# Patient Record
Sex: Female | Born: 1980 | Race: White | Hispanic: No | Smoking: Never smoker
Health system: Southern US, Community
[De-identification: ages and names within clinical notes are randomized; demographics above are authoritative.]

## PROBLEM LIST (undated history)

## (undated) DIAGNOSIS — R519 Headache, unspecified: Secondary | ICD-10-CM

## (undated) DIAGNOSIS — N1370 Vesicoureteral-reflux, unspecified: Secondary | ICD-10-CM

## (undated) DIAGNOSIS — K602 Anal fissure, unspecified: Secondary | ICD-10-CM

## (undated) HISTORY — DX: Headache, unspecified: R51.9

## (undated) HISTORY — DX: Vesicoureteral-reflux, unspecified: N13.70

## (undated) HISTORY — DX: Anal fissure, unspecified: K60.2

---
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
## 2005-02-13 ENCOUNTER — Ambulatory Visit: Payer: Self-pay | Admitting: Family Medicine

## 2005-12-09 ENCOUNTER — Ambulatory Visit: Payer: Self-pay | Admitting: Family Medicine

## 2005-12-09 ENCOUNTER — Other Ambulatory Visit: Admission: RE | Admit: 2005-12-09 | Discharge: 2005-12-09 | Payer: Self-pay | Admitting: Family Medicine

## 2005-12-09 ENCOUNTER — Encounter: Payer: Self-pay | Admitting: Family Medicine

## 2005-12-09 LAB — CONVERTED CEMR LAB: Pap Smear: NORMAL

## 2006-12-11 ENCOUNTER — Telehealth (INDEPENDENT_AMBULATORY_CARE_PROVIDER_SITE_OTHER): Payer: Self-pay | Admitting: *Deleted

## 2006-12-14 ENCOUNTER — Encounter: Payer: Self-pay | Admitting: Family Medicine

## 2006-12-14 DIAGNOSIS — E785 Hyperlipidemia, unspecified: Secondary | ICD-10-CM | POA: Insufficient documentation

## 2006-12-14 DIAGNOSIS — Z87898 Personal history of other specified conditions: Secondary | ICD-10-CM | POA: Insufficient documentation

## 2006-12-14 DIAGNOSIS — D649 Anemia, unspecified: Secondary | ICD-10-CM | POA: Insufficient documentation

## 2006-12-15 ENCOUNTER — Ambulatory Visit: Payer: Self-pay | Admitting: Family Medicine

## 2006-12-15 ENCOUNTER — Other Ambulatory Visit: Admission: RE | Admit: 2006-12-15 | Discharge: 2006-12-15 | Payer: Self-pay | Admitting: Family Medicine

## 2006-12-15 ENCOUNTER — Encounter: Payer: Self-pay | Admitting: Family Medicine

## 2006-12-15 DIAGNOSIS — R61 Generalized hyperhidrosis: Secondary | ICD-10-CM | POA: Insufficient documentation

## 2006-12-23 ENCOUNTER — Encounter (INDEPENDENT_AMBULATORY_CARE_PROVIDER_SITE_OTHER): Payer: Self-pay | Admitting: *Deleted

## 2006-12-23 LAB — CONVERTED CEMR LAB: Pap Smear: NORMAL

## 2007-03-04 ENCOUNTER — Ambulatory Visit: Payer: Self-pay | Admitting: Family Medicine

## 2007-03-04 DIAGNOSIS — S139XXA Sprain of joints and ligaments of unspecified parts of neck, initial encounter: Secondary | ICD-10-CM | POA: Insufficient documentation

## 2007-12-19 ENCOUNTER — Telehealth: Payer: Self-pay | Admitting: Internal Medicine

## 2007-12-20 ENCOUNTER — Ambulatory Visit: Payer: Self-pay | Admitting: Family Medicine

## 2008-01-12 ENCOUNTER — Emergency Department: Payer: Self-pay | Admitting: Emergency Medicine

## 2008-02-15 ENCOUNTER — Encounter: Payer: Self-pay | Admitting: Family Medicine

## 2008-02-15 ENCOUNTER — Ambulatory Visit: Payer: Self-pay | Admitting: Family Medicine

## 2008-02-15 ENCOUNTER — Other Ambulatory Visit: Admission: RE | Admit: 2008-02-15 | Discharge: 2008-02-15 | Payer: Self-pay | Admitting: Family Medicine

## 2008-02-17 LAB — CONVERTED CEMR LAB
Albumin: 3.4 g/dL — ABNORMAL LOW (ref 3.5–5.2)
Alkaline Phosphatase: 46 units/L (ref 39–117)
BUN: 9 mg/dL (ref 6–23)
Basophils Absolute: 0.1 10*3/uL (ref 0.0–0.1)
Cholesterol: 187 mg/dL (ref 0–200)
Eosinophils Absolute: 0.1 10*3/uL (ref 0.0–0.7)
Eosinophils Relative: 1.1 % (ref 0.0–5.0)
GFR calc Af Amer: 154 mL/min
GFR calc non Af Amer: 127 mL/min
HCT: 37.4 % (ref 36.0–46.0)
HDL: 52.5 mg/dL (ref >39.0)
MCHC: 34.3 g/dL (ref 30.0–36.0)
MCV: 87.8 fL (ref 78.0–100.0)
Monocytes Absolute: 0.4 10*3/uL (ref 0.1–1.0)
Neutrophils Relative %: 63.2 % (ref 43.0–77.0)
Platelets: 221 10*3/uL (ref 150–400)
Potassium: 3.7 meq/L (ref 3.5–5.1)
RDW: 12.9 % (ref 11.5–14.6)
Sodium: 138 meq/L (ref 135–145)
Total Bilirubin: 0.5 mg/dL (ref 0.3–1.2)
Triglycerides: 109 mg/dL (ref 0–149)
VLDL: 22 mg/dL (ref 0–40)

## 2013-04-10 ENCOUNTER — Ambulatory Visit: Payer: Self-pay | Admitting: Physician Assistant

## 2013-04-10 LAB — URINALYSIS, COMPLETE
Bilirubin,UR: NEGATIVE
Glucose,UR: NEGATIVE mg/dL (ref 0–75)
Ketone: NEGATIVE
Nitrite: NEGATIVE
Ph: 6.5 (ref 4.5–8.0)
Protein: NEGATIVE
SPECIFIC GRAVITY: 1.005 (ref 1.003–1.030)

## 2013-04-12 LAB — URINE CULTURE

## 2016-06-06 ENCOUNTER — Telehealth: Payer: Self-pay

## 2016-06-06 MED ORDER — NORGESTIM-ETH ESTRAD TRIPHASIC 0.18/0.215/0.25 MG-35 MCG PO TABS: 1.0000 | ORAL_TABLET | Freq: Every day | ORAL | 1 refills | Status: DC

## 2016-06-06 NOTE — Telephone Encounter (Signed)
Pt calling for refill of trisprintec to get her to appt 6/18.  [phone].  Pt aware refills eRx'd.

## 2016-07-07 ENCOUNTER — Ambulatory Visit: Payer: Self-pay | Admitting: Advanced Practice Midwife

## 2016-08-04 ENCOUNTER — Ambulatory Visit: Payer: Self-pay | Admitting: Advanced Practice Midwife

## 2018-09-28 ENCOUNTER — Ambulatory Visit (INDEPENDENT_AMBULATORY_CARE_PROVIDER_SITE_OTHER): Payer: BLUE CROSS/BLUE SHIELD | Admitting: Maternal Newborn

## 2018-09-28 ENCOUNTER — Encounter: Payer: Self-pay | Admitting: Maternal Newborn

## 2018-09-28 ENCOUNTER — Other Ambulatory Visit (HOSPITAL_COMMUNITY): Admission: RE | Admit: 2018-09-28 | Payer: Self-pay | Source: Ambulatory Visit

## 2018-09-28 ENCOUNTER — Other Ambulatory Visit: Payer: Self-pay

## 2018-09-28 VITALS — BP 120/70 | HR 86 | Ht 63.0 in | Wt 236.0 lb

## 2018-09-28 DIAGNOSIS — Z01419 Encounter for gynecological examination (general) (routine) without abnormal findings: Secondary | ICD-10-CM | POA: Diagnosis not present

## 2018-09-28 DIAGNOSIS — Z124 Encounter for screening for malignant neoplasm of cervix: Secondary | ICD-10-CM

## 2018-09-28 NOTE — Patient Instructions (Signed)
Preventive Care 21-39 Years Old, Female Preventive care refers to visits with your health care provider and lifestyle choices that can promote health and wellness. This includes:  A yearly physical exam. This may also be called an annual well check.  Regular dental visits and eye exams.  Immunizations.  Screening for certain conditions.  Healthy lifestyle choices, such as eating a healthy diet, getting regular exercise, not using drugs or products that contain nicotine and tobacco, and limiting alcohol use. What can I expect for my preventive care visit? Physical exam Your health care provider will check your:  Height and weight. This may be used to calculate body mass index (BMI), which tells if you are at a healthy weight.  Heart rate and blood pressure.  Skin for abnormal spots. Counseling Your health care provider may ask you questions about your:  Alcohol, tobacco, and drug use.  Emotional well-being.  Home and relationship well-being.  Sexual activity.  Eating habits.  Work and work environment.  Method of birth control.  Menstrual cycle.  Pregnancy history. What immunizations do I need?  Influenza (flu) vaccine  This is recommended every year. Tetanus, diphtheria, and pertussis (Tdap) vaccine  You may need a Td booster every 10 years. Varicella (chickenpox) vaccine  You may need this if you have not been vaccinated. Human papillomavirus (HPV) vaccine  If recommended by your health care provider, you may need three doses over 6 months. Measles, mumps, and rubella (MMR) vaccine  You may need at least one dose of MMR. You may also need a second dose. Meningococcal conjugate (MenACWY) vaccine  One dose is recommended if you are age 19-21 years and a first-year college student living in a residence hall, or if you have one of several medical conditions. You may also need additional booster doses. Pneumococcal conjugate (PCV13) vaccine  You may need  this if you have certain conditions and were not previously vaccinated. Pneumococcal polysaccharide (PPSV23) vaccine  You may need one or two doses if you smoke cigarettes or if you have certain conditions. Hepatitis A vaccine  You may need this if you have certain conditions or if you travel or work in places where you may be exposed to hepatitis A. Hepatitis B vaccine  You may need this if you have certain conditions or if you travel or work in places where you may be exposed to hepatitis B. Haemophilus influenzae type b (Hib) vaccine  You may need this if you have certain conditions. You may receive vaccines as individual doses or as more than one vaccine together in one shot (combination vaccines). Talk with your health care provider about the risks and benefits of combination vaccines. What tests do I need?  Blood tests  Lipid and cholesterol levels. These may be checked every 5 years starting at age 20.  Hepatitis C test.  Hepatitis B test. Screening  Diabetes screening. This is done by checking your blood sugar (glucose) after you have not eaten for a while (fasting).  Sexually transmitted disease (STD) testing.  BRCA-related cancer screening. This may be done if you have a family history of breast, ovarian, tubal, or peritoneal cancers.  Pelvic exam and Pap test. This may be done every 3 years starting at age 21. Starting at age 30, this may be done every 5 years if you have a Pap test in combination with an HPV test. Talk with your health care provider about your test results, treatment options, and if necessary, the need for more tests.   Follow these instructions at home: Eating and drinking   Eat a diet that includes fresh fruits and vegetables, whole grains, lean protein, and low-fat dairy.  Take vitamin and mineral supplements as recommended by your health care provider.  Do not drink alcohol if: ? Your health care provider tells you not to drink. ? You are  pregnant, may be pregnant, or are planning to become pregnant.  If you drink alcohol: ? Limit how much you have to 0-1 drink a day. ? Be aware of how much alcohol is in your drink. In the [place] one drink equals one 12 oz bottle of beer (355 mL), one 5 oz glass of wine (148 mL), or one 1 oz glass of hard liquor (44 mL). Lifestyle  Take daily care of your teeth and gums.  Stay active. Exercise for at least 30 minutes on 5 or more days each week.  Do not use any products that contain nicotine or tobacco, such as cigarettes, e-cigarettes, and chewing tobacco. If you need help quitting, ask your health care provider.  If you are sexually active, practice safe sex. Use a condom or other form of birth control (contraception) in order to prevent pregnancy and STIs (sexually transmitted infections). If you plan to become pregnant, see your health care provider for a preconception visit. What's next?  Visit your health care provider once a year for a well check visit.  Ask your health care provider how often you should have your eyes and teeth checked.  Stay up to date on all vaccines. This information is not intended to replace advice given to you by your health care provider. Make sure you discuss any questions you have with your health care provider. Document Released: 03/04/2001 Document Revised: 09/17/2017 Document Reviewed: 09/17/2017 Elsevier Patient Education  2020 Elsevier Inc.  

## 2018-09-28 NOTE — Progress Notes (Signed)
Gynecology Annual Exam  PCP: Judy Pimpleower, Marne A, MD  Chief Complaint:  Chief Complaint  Patient presents with  . Gynecologic Exam    History of Present Illness: Patient is a 38 y.o. G0P0000 presenting for an annual exam. The patient has no complaints today.   LMP: Patient's last menstrual period was 09/08/2018 (exact date). Average Interval: regular, 28 days Duration of flow: 4-6 days Heavy Menses: no Clots: no Intermenstrual Bleeding: no Postcoital Bleeding: no Dysmenorrhea: occasional mild cramping  The patient is sexually active. She currently uses condoms for contraception. She does not have dyspareunia.  The patient does perform self breast exams.  She is adopted and not aware of any family history of breast or ovarian cancer in her family.  The patient wears seatbelts: yes.   The patient has regular exercise: yes, daily. Has lost 40 lb. over the past year.  The patient does not have current symptoms of depression.    Review of Systems  Constitutional: Negative.   HENT: Negative.   Eyes: Negative.   Respiratory: Negative for shortness of breath and wheezing.   Cardiovascular: Negative for chest pain and palpitations.  Gastrointestinal: Negative for abdominal pain, constipation, diarrhea and nausea.  Genitourinary: Negative.   Musculoskeletal: Negative.   Skin: Negative.   Neurological: Negative.   Endo/Heme/Allergies: Negative.   Psychiatric/Behavioral: Negative.   All other systems reviewed and are negative.   Past Medical History:  Past Medical History:  Diagnosis Date  . Anal fissure   . Vesicoureteral reflux     Past Surgical History:  Past Surgical History:  Procedure Laterality Date  . MMK / ANTERIOR VESICOURETHROPEXY / URETHROPEXY Left 1992   Left kidney @ [facility]    Gynecologic History:  Patient's last menstrual period was 09/08/2018 (exact date). Contraception: condoms Last Pap: 03/29/2015. Results were: NILM and HPV Positive  Obstetric History:  G0P0000  Family History:  Family History  Adopted: Yes    Social History:  Social History   Socioeconomic History  . Marital status: Married    Spouse name: Not on file  . Number of children: Not on file  . Years of education: Not on file  . Highest education level: Not on file  Occupational History  . Not on file  Social Needs  . Financial resource strain: Not on file  . Food insecurity    Worry: Not on file    Inability: Not on file  . Transportation needs    Medical: Not on file    Non-medical: Not on file  Tobacco Use  . Smoking status: Former Games developermoker  . Smokeless tobacco: Never Used  Substance and Sexual Activity  . Alcohol use: Yes    Comment: Social   . Drug use: Never  . Sexual activity: Yes    Birth control/protection: None, Condom  Lifestyle  . Physical activity    Days per week: Not on file    Minutes per session: Not on file  . Stress: Not on file  Relationships  . Social Musicianconnections    Talks on phone: Not on file    Gets together: Not on file    Attends religious service: Not on file    Active member of club or organization: Not on file    Attends meetings of clubs or organizations: Not on file    Relationship status: Not on file  . Intimate partner violence    Fear of current or ex partner: Not on file    Emotionally abused: Not  on file    Physically abused: Not on file    Forced sexual activity: Not on file  Other Topics Concern  . Not on file  Social History Narrative  . Not on file    Allergies:  No Known Allergies  Medications: Prior to Admission medications   Not on File    Physical Exam Vitals: Blood pressure 120/70, pulse 86, height 5\' 3"  (1.6 m), weight 236 lb (107 kg), last menstrual period 09/08/2018.  General: NAD HEENT: normocephalic, anicteric Thyroid: no enlargement Pulmonary: No increased work of breathing, CTAB Cardiovascular: RRR, no murmurs, rubs, or gallops Breast: Breasts symmetrical, no tenderness, no  palpable nodules or masses, no skin or nipple retraction present, no nipple discharge.  No axillary or supraclavicular lymphadenopathy. Abdomen: Soft, non-tender, non-distended.  Umbilicus without lesions.  No hepatomegaly, splenomegaly or masses palpable. No evidence of hernia  Genitourinary:  External: Normal external female genitalia.  Normal urethral  meatus, normal Bartholin's and Skene's glands.    Vagina: Normal vaginal mucosa, no evidence of prolapse.    Cervix: Grossly normal in appearance, no bleeding  Uterus: Non-enlarged, mobile, normal contour.  No CMT  Adnexa: ovaries non-enlarged, no adnexal masses  Rectal: deferred  Lymphatic: no evidence of inguinal lymphadenopathy Extremities: no edema, erythema, or tenderness Neurologic: Grossly intact Psychiatric: mood appropriate, affect full  Assessment: 38 y.o. G0P0000 annual exam  Plan: Problem List Items Addressed This Visit    None    Visit Diagnoses    Encounter for annual routine gynecological examination    -  Primary   Pap smear for cervical cancer screening       Relevant Orders   Cytology - PAP      1) STI screening was offered and declined  2) ASCCP guidelines and rationale discussed.  Patient opts for every 3 year screening interval  3) Contraception - Was using OCPs but has discontinued them and is currently using condoms. She did not conceive with her former partner despite using no methods and is curious about her fertility. Recommended infertility visit with MD if she would like to pursue tests/workup.  4) Routine healthcare maintenance including cholesterol, diabetes screening managed by PCP  5) Follow up 1 year for an annual exam.  Avel Sensor, CNM 09/28/2018  2:05 PM

## 2018-10-01 LAB — CYTOLOGY - PAP
Adequacy: ABSENT
Diagnosis: NEGATIVE
HPV 16/18/45 genotyping: NEGATIVE
HPV: DETECTED — AB

## 2018-12-21 ENCOUNTER — Encounter: Payer: Self-pay | Admitting: *Deleted

## 2018-12-21 ENCOUNTER — Ambulatory Visit
Admission: EM | Admit: 2018-12-21 | Discharge: 2018-12-21 | Disposition: A | Discharge status: Home / Self Care | Payer: BLUE CROSS/BLUE SHIELD | Attending: Emergency Medicine | Admitting: Emergency Medicine

## 2018-12-21 ENCOUNTER — Other Ambulatory Visit: Payer: Self-pay

## 2018-12-21 DIAGNOSIS — R05 Cough: Secondary | ICD-10-CM

## 2018-12-21 DIAGNOSIS — U071 COVID-19: Secondary | ICD-10-CM

## 2018-12-21 LAB — POC SARS CORONAVIRUS 2 AG -  ED: SARS Coronavirus 2 Ag: POSITIVE — AB

## 2018-12-21 NOTE — ED Provider Notes (Signed)
Rachael Mora    CSN: [id number] Arrival date & time: 12/21/18  1455      History   Chief Complaint Chief Complaint  Patient presents with  . Nasal Congestion  . Cough    HPI Rachael Mora is a 38 y.o. female.   Patient presents with 2-day history of nasal congestion, nonproductive cough, fever, fatigue, loss of smell.  No treatments attempted at home.  Patient denies shortness of breath, vomiting, diarrhea, rash, or other symptoms.    The history is provided by the patient.    Past Medical History:  Diagnosis Date  . Anal fissure   . Vesicoureteral reflux     Patient Active Problem List   Diagnosis Date Noted  . CERVICAL STRAIN 03/04/2007  . HYPERHIDROSIS 12/15/2006  . HYPERLIPIDEMIA 12/14/2006  . ANEMIA 12/14/2006  . MIGRAINES, HX OF 12/14/2006    Past Surgical History:  Procedure Laterality Date  . MMK / ANTERIOR VESICOURETHROPEXY / URETHROPEXY Left 1992   Left kidney @ [facility]    OB History    Gravida  0   Para  0   Term  0   Preterm  0   AB  0   Living  0     SAB  0   TAB  0   Ectopic  0   Multiple  0   Live Births  0            Home Medications    Prior to Admission medications   Not on File    Family History Family History  Adopted: Yes    Social History Social History   Tobacco Use  . Smoking status: Former Research scientist (life sciences)  . Smokeless tobacco: Never Used  Substance Use Topics  . Alcohol use: Yes    Comment: Social   . Drug use: Never     Allergies   Patient has no known allergies.   Review of Systems Review of Systems  Constitutional: Positive for fatigue and fever. Negative for chills.  HENT: Positive for congestion. Negative for ear pain, rhinorrhea and sore throat.   Eyes: Negative for pain and visual disturbance.  Respiratory: Positive for cough. Negative for shortness of breath.   Cardiovascular: Negative for chest pain and palpitations.  Gastrointestinal: Negative for abdominal pain, diarrhea,  nausea and vomiting.  Genitourinary: Negative for dysuria and hematuria.  Musculoskeletal: Negative for arthralgias and back pain.  Skin: Negative for color change and rash.  Neurological: Negative for seizures and syncope.  All other systems reviewed and are negative.    Physical Exam Triage Vital Signs ED Triage Vitals  Enc Vitals Group     BP      Pulse      Resp      Temp      Temp src      SpO2      Weight      Height      Head Circumference      Peak Flow      Pain Score      Pain Loc      Pain Edu?      Excl. in [place]?    No data found.  Updated Vital Signs BP 133/83 (BP Location: Left Arm)   Pulse 95   Temp 98.3 F (36.8 C) (Oral)   Resp 16   LMP 12/02/2018   SpO2 99%   Visual Acuity Right Eye Distance:   Left Eye Distance:   Bilateral Distance:  Right Eye Near:   Left Eye Near:    Bilateral Near:     Physical Exam Vitals signs and nursing note reviewed.  Constitutional:      General: She is not in acute distress.    Appearance: She is well-developed.  HENT:     Head: Normocephalic and atraumatic.     Right Ear: Tympanic membrane normal.     Left Ear: Tympanic membrane normal.     Nose: Nose normal.     Mouth/Throat:     Mouth: Mucous membranes are moist.     Pharynx: Oropharynx is clear.  Eyes:     Conjunctiva/sclera: Conjunctivae normal.  Neck:     Musculoskeletal: Neck supple.  Cardiovascular:     Rate and Rhythm: Normal rate and regular rhythm.     Heart sounds: No murmur.  Pulmonary:     Effort: Pulmonary effort is normal. No respiratory distress.     Breath sounds: Normal breath sounds. No wheezing or rhonchi.  Abdominal:     General: Bowel sounds are normal.     Palpations: Abdomen is soft.     Tenderness: There is no abdominal tenderness. There is no guarding or rebound.  Skin:    General: Skin is warm and dry.     Findings: No rash.  Neurological:     General: No focal deficit present.     Mental Status: She is alert and  oriented to person, place, and time.  Psychiatric:        Mood and Affect: Mood normal.        Behavior: Behavior normal.      UC Treatments / Results  Labs (all labs ordered are listed, but only abnormal results are displayed) Labs Reviewed  POC SARS CORONAVIRUS 2 AG -  ED - Abnormal; Notable for the following components:      Result Value   SARS Coronavirus 2 Ag Positive (*)    All other components within normal limits    EKG   Radiology No results found.  Procedures Procedures (including critical care time)  Medications Ordered in UC Medications - No data to display  Initial Impression / Assessment and Plan / UC Course  I have reviewed the triage vital signs and the nursing notes.  Pertinent labs & imaging results that were available during my care of the patient were reviewed by me and considered in my medical decision making (see chart for details).   COVID-19.  POC COVID test positive.  Discussed guidelines for self quarantine.  Instructed her to go to the emergency department if she develops a high fever not responding to Tylenol, acute shortness of breath, severe diarrhea, or other concerning symptoms.  Patient agrees to plan of care.     Final Clinical Impressions(s) / UC Diagnoses   Final diagnoses:  COVID-19     Discharge Instructions     Your COVID test is positive.    You need to self quarantine for 10 days after the onset of your symptoms and 3 days after your last fever.  Go to the emergency department if you develop high fever, shortness of breath, severe diarrhea, or other concerning symptoms.         ED Prescriptions    None     PDMP not reviewed this encounter.   Mickie Bail, NP 12/21/18 1539

## 2018-12-21 NOTE — ED Triage Notes (Signed)
Patient reports nasal congestion and cough for a couple days, no fevers, and mild fatigue. Reports no smell x 2 days. Taste is muted but not absent.

## 2018-12-21 NOTE — Discharge Instructions (Signed)
Your COVID test is positive.    You need to self quarantine for 10 days after the onset of your symptoms and 3 days after your last fever.  Go to the emergency department if you develop high fever, shortness of breath, severe diarrhea, or other concerning symptoms.

## 2019-02-11 ENCOUNTER — Ambulatory Visit: Payer: 59 | Attending: Internal Medicine

## 2019-02-11 DIAGNOSIS — Z20822 Contact with and (suspected) exposure to covid-19: Secondary | ICD-10-CM | POA: Insufficient documentation

## 2019-02-12 LAB — NOVEL CORONAVIRUS, NAA: SARS-CoV-2, NAA: NOT DETECTED

## 2019-05-10 ENCOUNTER — Other Ambulatory Visit: Payer: Self-pay

## 2019-05-10 ENCOUNTER — Ambulatory Visit: Payer: 59 | Admitting: Family Medicine

## 2019-05-10 ENCOUNTER — Encounter: Payer: Self-pay | Admitting: Family Medicine

## 2019-05-10 DIAGNOSIS — L0292 Furuncle, unspecified: Secondary | ICD-10-CM

## 2019-05-10 MED ORDER — CEPHALEXIN 500 MG PO CAPS: 500.0000 mg | ORAL_CAPSULE | Freq: Three times a day (TID) | ORAL | 0 refills | Status: DC

## 2019-05-10 NOTE — Progress Notes (Signed)
Subjective:    Patient ID: Rachael Mora, female    DOB: Oct 28, 1980, 39 y.o.   MRN: [id number]  This visit occurred during the SARS-CoV-2 public health emergency.  Safety protocols were in place, including screening questions prior to the visit, additional usage of staff PPE, and extensive cleaning of exam room while observing appropriate contact time as indicated for disinfecting solutions.    HPI Pt presents for recurrent skin infection on R thigh  Wt Readings from Last 3 Encounters:  05/10/19 224 lb 5 oz (101.7 kg)  09/28/18 236 lb (107 kg)   38.50 kg/m  Has not been seen here in a long time  Planning to re establish with Deboraha Sprang in May   Has a boil on the inside of R thigh - near groin  Has taken hot showers and they help Did have drainage- improved now  Now a small area near it is hard - very hard to see and more painful - throbbing   Using gauze and abx ointment  Using some peroxide  Also soap and water   She usually works out a lot -had to hold off   Patient Active Problem List   Diagnosis Date Noted  . Boil 05/10/2019  . CERVICAL STRAIN 03/04/2007  . HYPERHIDROSIS 12/15/2006  . HYPERLIPIDEMIA 12/14/2006  . ANEMIA 12/14/2006  . MIGRAINES, HX OF 12/14/2006   Past Medical History:  Diagnosis Date  . Anal fissure   . Vesicoureteral reflux    Past Surgical History:  Procedure Laterality Date  . MMK / ANTERIOR VESICOURETHROPEXY / URETHROPEXY Left 1992   Left kidney @ [facility]   Social History   Tobacco Use  . Smoking status: Former Games developer  . Smokeless tobacco: Never Used  Substance Use Topics  . Alcohol use: Yes    Comment: Social   . Drug use: Never   Family History  Adopted: Yes   No Known Allergies No current outpatient medications on file prior to visit.   No current facility-administered medications on file prior to visit.    Review of Systems  Constitutional: Negative for activity change, appetite change, fatigue, fever and  unexpected weight change.  HENT: Negative for congestion, ear pain, rhinorrhea, sinus pressure and sore throat.   Eyes: Negative for pain, redness and visual disturbance.  Respiratory: Negative for cough, shortness of breath and wheezing.   Cardiovascular: Negative for chest pain and palpitations.  Gastrointestinal: Negative for abdominal pain, blood in stool, constipation and diarrhea.  Endocrine: Negative for polydipsia and polyuria.  Genitourinary: Negative for dysuria, frequency and urgency.  Musculoskeletal: Negative for arthralgias, back pain and myalgias.  Skin: Positive for wound. Negative for pallor and rash.  Allergic/Immunologic: Negative for environmental allergies.  Neurological: Negative for dizziness, syncope and headaches.  Hematological: Negative for adenopathy. Does not bruise/bleed easily.  Psychiatric/Behavioral: Negative for decreased concentration and dysphoric mood. The patient is not nervous/anxious.        Objective:   Physical Exam Constitutional:      Appearance: Normal appearance. She is obese. She is not ill-appearing.  Eyes:     Conjunctiva/sclera: Conjunctivae normal.     Pupils: Pupils are equal, round, and reactive to light.  Cardiovascular:     Rate and Rhythm: Regular rhythm. Tachycardia present.  Skin:    General: Skin is warm and dry.     Findings: Erythema present. No bruising or rash.     Comments: Right inner thigh- adjacent to labia majora- erythema in oval shape with induration -  small opening anteriorly (not actively draining-but appears to be healing)  Posterior to that 1.5 cm area of much more firm induration with tenderness (not fluctuant) -not draining   Pt does shave- evident that ingrown hairs are possible  Neurological:     Mental Status: She is alert.  Psychiatric:        Mood and Affect: Mood normal.     Comments: Pleasant and talkative           Assessment & Plan:   Problem List Items Addressed This Visit       Musculoskeletal and Integument   Boil    Boil looks to be drained/flat anteriorly but there is an area of more firm induration adjoining it posteriorly  (not fluctuant)  tx with cephalexin 500 mg tid  Soap and water cleans frequent  Stop shaving  Warm compresses as frequently as possible  This area may evolve into abscess -if symptoms worsen (pain/redness/size) inst to call and f/u  If so may need I and D  Now-area is too firm to consider       Relevant Medications   cephALEXin (KEFLEX) 500 MG capsule

## 2019-05-10 NOTE — Assessment & Plan Note (Signed)
Boil looks to be drained/flat anteriorly but there is an area of more firm induration adjoining it posteriorly  (not fluctuant)  tx with cephalexin 500 mg tid  Soap and water cleans frequent  Stop shaving  Warm compresses as frequently as possible  This area may evolve into abscess -if symptoms worsen (pain/redness/size) inst to call and f/u  If so may need I and D  Now-area is too firm to consider

## 2019-05-10 NOTE — Patient Instructions (Signed)
Keep the boil area clean with soap and water /dry it well   Warm compresses help-do as often as possible  If it drains -that is ok  Take keflex as directed (antibiotic)   If symptoms worsen / redness/pain/size -let us know   Shaving in this area makes symptoms worse   Keep [place] posted

## 2019-05-12 ENCOUNTER — Ambulatory Visit: Payer: Self-pay | Admitting: Obstetrics and Gynecology

## 2019-06-01 ENCOUNTER — Other Ambulatory Visit: Payer: Self-pay

## 2019-06-01 ENCOUNTER — Ambulatory Visit: Payer: 59 | Admitting: Family Medicine

## 2019-06-01 ENCOUNTER — Encounter: Payer: Self-pay | Admitting: Family Medicine

## 2019-06-01 VITALS — BP 142/92 | HR 77 | Temp 97.7°F | Ht 64.0 in | Wt 230.0 lb

## 2019-06-01 DIAGNOSIS — R350 Frequency of micturition: Secondary | ICD-10-CM

## 2019-06-01 DIAGNOSIS — R109 Unspecified abdominal pain: Secondary | ICD-10-CM

## 2019-06-01 DIAGNOSIS — N3090 Cystitis, unspecified without hematuria: Secondary | ICD-10-CM | POA: Diagnosis not present

## 2019-06-01 LAB — POC URINALSYSI DIPSTICK (AUTOMATED)
Bilirubin, UA: NEGATIVE
Blood, UA: 200
Glucose, UA: NEGATIVE
Ketones, UA: NEGATIVE
Nitrite, UA: NEGATIVE
Protein, UA: POSITIVE — AB
Spec Grav, UA: 1.02 (ref 1.010–1.025)
Urobilinogen, UA: 0.2 E.U./dL
pH, UA: 7 (ref 5.0–8.0)

## 2019-06-01 MED ORDER — CIPROFLOXACIN HCL 500 MG PO TABS: 500.0000 mg | ORAL_TABLET | Freq: Two times a day (BID) | ORAL | 0 refills | Status: DC

## 2019-06-01 MED ORDER — TRAMADOL HCL 50 MG PO TABS: 50.0000 mg | ORAL_TABLET | Freq: Three times a day (TID) | ORAL | 0 refills | Status: AC | PRN

## 2019-06-01 MED ORDER — PROMETHAZINE HCL 25 MG PO TABS
25.0000 mg | ORAL_TABLET | Freq: Three times a day (TID) | ORAL | 0 refills | Status: DC | PRN
Start: 2019-06-01 — End: 2019-07-08

## 2019-06-01 NOTE — Patient Instructions (Addendum)
I think you have a urine (possible) kidney infection  Take the cipro as directed  Drink lots of water   If no improvement in flank pain tomorrow I want to get a CT scan  Call us in the am please to update   You can take tylenol for pain  I also px tramadol (strong/ can sedate/use caution) Phenergan for nausea -also sedating   Use heat on your back for 10 minutes at a time   Also strain your urine   If suddenly worse tonight -go to the ER

## 2019-06-01 NOTE — Assessment & Plan Note (Signed)
With pos ua (incl blood) and uti symptoms  Disc poss of pyelonephritis vs passing kidney stone  Px cipro for uti (cx pending) Tramadol for pain phenergan for nausea with warning of sedation  Given urine strainer  Told to call in am - we may order a CT scan unless much improved  inst to go to ER if symptoms worsen tonight  Alert [place] if change or new symptoms

## 2019-06-01 NOTE — Assessment & Plan Note (Signed)
Voiding symptoms and flank pain - with positive ua with blood/protein and leukocytes  She has had kidney infections in the past and (? Surgery for upj issue-unsure)  This is complicated by flank pain - now on R side and significant  Px cipro 500 mg bid (given complicated uti) and culture is pending Disc possib of renal stone also-given a urine strainer and would consider CT scan tomorrow if not significantly improved  Tramadol for pain and phenergan for nausea also px  Urged lots of fluids  If worse tonight- inst to go to the ER (she voiced understanding)

## 2019-06-01 NOTE — Progress Notes (Signed)
Subjective:    Patient ID: Rachael Mora, female    DOB: July 17, 1980, 39 y.o.   MRN: [id number]  This visit occurred during the SARS-CoV-2 public health emergency.  Safety protocols were in place, including screening questions prior to the visit, additional usage of staff PPE, and extensive cleaning of exam room while observing appropriate contact time as indicated for disinfecting solutions.    HPI Pt presents with urinary symptoms and back pain   Wt Readings from Last 3 Encounters:  06/01/19 230 lb (104.3 kg)  05/10/19 224 lb 5 oz (101.7 kg)  09/28/18 236 lb (107 kg)   39.48 kg/m   Symptoms started about a week ago  Had pain on L side and now on her R  Pain is enough to make her nauseated   Today it is worse   Hard to get comfortable  Thinks she may have had a kidney stone in the past   Also past h/o UPJ  Had anterior vesicourethropexy on the L side in 1992 at [facility] (per record)  Has had kidney infections before /per pt   Urine -frequency and a little bit of burning  Urgency  That started after the pain started   Cloudy urine  No change in smell  Drinks lots of water  No blood in urine   No pelvic pain   LMP is 05/12/19 and regular  No missed menses Sees gyn  Uses condoms for contraception   Ua: Results for orders placed or performed in visit on 06/01/19  POCT Urinalysis Dipstick (Automated)  Result Value Ref Range   Color, UA Yellow    Clarity, UA Cloudy    Glucose, UA Negative Negative   Bilirubin, UA Negative    Ketones, UA Negative    Spec Grav, UA 1.020 1.010 - 1.025   Blood, UA 200 Ery/uL    pH, UA 7.0 5.0 - 8.0   Protein, UA Positive (A) Negative   Urobilinogen, UA 0.2 0.2 or 1.0 E.U./dL   Nitrite, UA Negative    Leukocytes, UA Moderate (2+) (A) Negative      Temp: 97.7 F (36.5 C)   Patient Active Problem List   Diagnosis Date Noted  . Cystitis 06/01/2019  . Right flank pain 06/01/2019  . Boil 05/10/2019  . CERVICAL STRAIN  03/04/2007  . HYPERHIDROSIS 12/15/2006  . HYPERLIPIDEMIA 12/14/2006  . ANEMIA 12/14/2006  . MIGRAINES, HX OF 12/14/2006   Past Medical History:  Diagnosis Date  . Anal fissure   . Vesicoureteral reflux    Past Surgical History:  Procedure Laterality Date  . MMK / ANTERIOR VESICOURETHROPEXY / URETHROPEXY Left 1992   Left kidney @ [facility]   Social History   Tobacco Use  . Smoking status: Former Research scientist (life sciences)  . Smokeless tobacco: Never Used  Substance Use Topics  . Alcohol use: Yes    Comment: Social   . Drug use: Never   Family History  Adopted: Yes   No Known Allergies No current outpatient medications on file prior to visit.   No current facility-administered medications on file prior to visit.     Review of Systems  Constitutional: Negative for activity change, appetite change, fatigue, fever and unexpected weight change.  HENT: Negative for congestion, ear pain, rhinorrhea, sinus pressure and sore throat.   Eyes: Negative for pain, redness and visual disturbance.  Respiratory: Negative for cough, shortness of breath and wheezing.   Cardiovascular: Negative for chest pain and palpitations.  Gastrointestinal: Negative for abdominal pain,  blood in stool, constipation and diarrhea.  Endocrine: Negative for polydipsia and polyuria.  Genitourinary: Positive for dysuria, flank pain, frequency and urgency. Negative for menstrual problem, pelvic pain and vaginal bleeding.  Musculoskeletal: Positive for back pain. Negative for arthralgias and myalgias.  Skin: Negative for pallor and rash.  Allergic/Immunologic: Negative for environmental allergies.  Neurological: Negative for dizziness, syncope and headaches.  Hematological: Negative for adenopathy. Does not bruise/bleed easily.  Psychiatric/Behavioral: Negative for decreased concentration and dysphoric mood. The patient is not nervous/anxious.        Objective:   Physical Exam Constitutional:      General: She is not in acute  distress.    Appearance: Normal appearance. She is well-developed. She is obese.     Comments: In discomfort from R flank pain - cannot get comfortable Not distressed  HENT:     Head: Normocephalic and atraumatic.     Mouth/Throat:     Mouth: Mucous membranes are moist.     Pharynx: Oropharynx is clear.  Eyes:     General: No scleral icterus.    Conjunctiva/sclera: Conjunctivae normal.     Pupils: Pupils are equal, round, and reactive to light.  Cardiovascular:     Rate and Rhythm: Normal rate and regular rhythm.     Heart sounds: Normal heart sounds.  Pulmonary:     Effort: Pulmonary effort is normal. No respiratory distress.     Breath sounds: Normal breath sounds. No wheezing or rales.  Abdominal:     General: Bowel sounds are normal. There is no distension.     Palpations: Abdomen is soft.     Tenderness: There is abdominal tenderness. There is right CVA tenderness. There is no left CVA tenderness or rebound.     Comments: No suprapubic tenderness or fullness    Some R cva tenderness -mild  She cannot get comfortable with flank pain  No rib tenderness   No L cva tenderness  Musculoskeletal:     Cervical back: Normal range of motion and neck supple.     Right lower leg: No edema.     Left lower leg: No edema.  Lymphadenopathy:     Cervical: No cervical adenopathy.  Skin:    Coloration: Skin is not pale.     Findings: No erythema or rash.  Neurological:     Mental Status: She is alert.  Psychiatric:        Mood and Affect: Mood normal.     Comments: Good historian           Assessment & Plan:   Problem List Items Addressed This Visit      Genitourinary   Cystitis - Primary    Voiding symptoms and flank pain - with positive ua with blood/protein and leukocytes  She has had kidney infections in the past and (? Surgery for upj issue-unsure)  This is complicated by flank pain - now on R side and significant  Px cipro 500 mg bid (given complicated uti) and  culture is pending Disc possib of renal stone also-given a urine strainer and would consider CT scan tomorrow if not significantly improved  Tramadol for pain and phenergan for nausea also px  Urged lots of fluids  If worse tonight- inst to go to the ER (she voiced understanding)      Relevant Orders   Urine Culture     Other   Right flank pain    With pos ua (incl blood) and uti symptoms  Disc  poss of pyelonephritis vs passing kidney stone  Px cipro for uti (cx pending) Tramadol for pain phenergan for nausea with warning of sedation  Given urine strainer  Told to call in am - we may order a CT scan unless much improved  inst to go to ER if symptoms worsen tonight  Alert [place] if change or new symptoms         Other Visit Diagnoses    Urinary frequency       Relevant Orders   POCT Urinalysis Dipstick (Automated) (Completed)

## 2019-06-02 ENCOUNTER — Telehealth: Payer: Self-pay | Admitting: *Deleted

## 2019-06-02 NOTE — Telephone Encounter (Signed)
Great!  Will continue to monitor

## 2019-06-02 NOTE — Telephone Encounter (Signed)
Left VM requesting pt to call us back and update [place] on how she's feeling

## 2019-06-02 NOTE — Telephone Encounter (Signed)
-----   Message from Judy Pimple, MD sent at 06/02/2019 10:02 AM EDT ----- I saw her yesterday for uti/flank pain  Please call and check in with her to see how she is feeling (esp with flank pain) Thanks

## 2019-06-02 NOTE — Telephone Encounter (Signed)
Pt left v/m  That pt was seen on 06/01/19 and today (06/02/19) pt is feeling a lot better; no side pain today and has not had to take any pain med today. Pt thinks Cipro is working.

## 2019-06-03 LAB — URINE CULTURE
MICRO NUMBER:: 10469367
SPECIMEN QUALITY:: ADEQUATE

## 2019-06-06 ENCOUNTER — Ambulatory Visit: Payer: Self-pay | Admitting: Family Medicine

## 2019-07-08 ENCOUNTER — Other Ambulatory Visit: Payer: Self-pay

## 2019-07-08 ENCOUNTER — Encounter: Payer: Self-pay | Admitting: Family Medicine

## 2019-07-08 ENCOUNTER — Ambulatory Visit (INDEPENDENT_AMBULATORY_CARE_PROVIDER_SITE_OTHER): Payer: 59 | Admitting: Family Medicine

## 2019-07-08 VITALS — BP 138/82 | HR 83 | Temp 96.7°F | Ht 64.0 in | Wt 228.0 lb

## 2019-07-08 DIAGNOSIS — E6609 Other obesity due to excess calories: Secondary | ICD-10-CM | POA: Diagnosis not present

## 2019-07-08 DIAGNOSIS — Z6839 Body mass index (BMI) 39.0-39.9, adult: Secondary | ICD-10-CM

## 2019-07-08 DIAGNOSIS — Z9189 Other specified personal risk factors, not elsewhere classified: Secondary | ICD-10-CM | POA: Diagnosis not present

## 2019-07-08 DIAGNOSIS — Z7689 Persons encountering health services in other specified circumstances: Secondary | ICD-10-CM

## 2019-07-08 NOTE — Progress Notes (Signed)
Subjective:    Patient ID: Rachael Mora, female    DOB: 1980/06/29, 39 y.o.   MRN: [id number]  HPI Chief Complaint  Patient presents with  . New Patient (Initial Visit)    Establish care - former pt of Dr Glori Bickers.    This is a 39 yo female who presents today to establish care. Works for [employer]. Enjoys outdoor activities, working out.   Last CPE- 09/28/2018- gyn Pap- 09/28/2018 Covid- has not had.  Tdap-2007 Flu- some years Eye- not in many years Dental- regularly Exercise- works out with a Clinical research associate, mix of cardio and weights Diet- has lost 50 pounds in last couple of years. Eats better than she used to. Drinks water, fewer sodas, eats 2 meals a day, goes out for lunch, usually has salad with chicken.   Her boss thinks that she has ADHD and she requests information about testing.  She did not have any problems with high school or college.  She does not feel like she has difficulty with attention just that her coworkers think differently than she does.  No problems with job performance.  No anxiety or depression.  She was married and does not use contraception for 9 years.  Has never gotten pregnant.  Currently uses condoms.  Declines STI testing today.  She is curious with regards to her fertility.  She has no immediate plans for attempting conception.  Periods are regular.  Right knee with some clicking.  No pain.  Review of Systems She denies headaches, chest pain, shortness of breath, abdominal pain, nausea/ vomiting/ diarrhea/constipation, dysuria, hematuria, urinary frequency, muscle or joint pain.    Objective:   Physical Exam Physical Exam  Constitutional: Oriented to person, place, and time. She appears well-developed and well-nourished.  HENT:  Head: Normocephalic and atraumatic.  Eyes: Conjunctivae are normal.  Neck: Normal range of motion. Neck supple.  Cardiovascular: Normal rate, regular rhythm and normal heart sounds.   Pulmonary/Chest: Effort normal and  breath sounds normal.  Musculoskeletal: No LE edema.  Neurological: Alert and oriented to person, place, and time.  Skin: Skin is warm and dry.  Psychiatric: Normal mood and affect. Behavior is normal. Judgment and thought content normal.  Vitals reviewed.     BP 138/82 (BP Location: Left Arm, Patient Position: Sitting, Cuff Size: Normal)   Pulse 83   Temp (!) 96.7 F (35.9 C) (Temporal)   Ht 5\' 4"  (1.626 m)   Wt 228 lb (103.4 kg)   SpO2 98%   BMI 39.14 kg/m  Wt Readings from Last 3 Encounters:  07/08/19 228 lb (103.4 kg)  06/01/19 230 lb (104.3 kg)  05/10/19 224 lb 5 oz (101.7 kg)       Assessment & Plan:  1. Encounter to establish care -Available EMR records reviewed  2. Class 2 obesity due to excess calories with body mass index (BMI) of 39.0 to 39.9 in adult, unspecified whether serious comorbidity present -Encouraged her to continue regular exercise and provided information regarding healthy food choices - Lipid Panel - TSH - Comprehensive metabolic panel - Hemoglobin A1c - Vitamin D, 25-hydroxy - CBC with Differential  3. At risk for fertility problems -Did not use birth control while married for 9 years, never become pregnant.  Discussed work-up by fertility specialists if she is contemplating conception.  They would be able to further advise her regarding freezing eggs and options regarding conception.  This visit occurred during the SARS-CoV-2 public health emergency.  Safety protocols were in  place, including screening questions prior to the visit, additional usage of staff PPE, and extensive cleaning of exam room while observing appropriate contact time as indicated for disinfecting solutions.      Olean Ree, FNP-BC  [facility], [facility]  07/08/2019 5:23 PM

## 2019-07-08 NOTE — Patient Instructions (Signed)
Good to see you today  A resource that I like is www.dietdoctor.com/diabetes/diet  Here are some guidelines to help you with meal planning -  Avoid all processed and packaged foods (bread, pasta, crackers, chips, etc) and beverages containing calories.  Avoid added sugars and excessive natural sugars.  Attention to how you feel if you consume artificial sweeteners.  Do they make you more hungry or raise your blood sugar?  With every meal and snack, aim to get 20 g of protein (3 ounces of meat, 4 ounces of fish, 3 eggs, protein powder, 1 cup Greek yogurt, 1 cup cottage cheese, etc.)  Increase fiber in the form of non-starchy vegetables.  These help you feel full with very little carbohydrates and are good for gut health.  Eat 1 serving healthy carb per meal- 1/2 cup brown rice, beans, potato, corn- pay attention to whether or not this significantly raises your blood sugar. If it does, reduce the frequency you consume these.   Eat 2-3 servings of lower sugar fruits daily.  This includes berries, apples, oranges, peaches, pears, one half banana.  Have small amounts of good fats such as avocado, nuts, olive oil, nut butters, olives.  Add a little cheese to your salads to make them tasty.    

## 2019-07-09 LAB — COMPREHENSIVE METABOLIC PANEL
AG Ratio: 1.3 (calc) (ref 1.0–2.5)
ALT: 14 U/L (ref 6–29)
AST: 14 U/L (ref 10–30)
Albumin: 4 g/dL (ref 3.6–5.1)
Alkaline phosphatase (APISO): 65 U/L (ref 31–125)
BUN: 13 mg/dL (ref 7–25)
CO2: 28 mmol/L (ref 20–32)
Calcium: 9.1 mg/dL (ref 8.6–10.2)
Chloride: 104 mmol/L (ref 98–110)
Creat: 0.89 mg/dL (ref 0.50–1.10)
Globulin: 3 g/dL (calc) (ref 1.9–3.7)
Glucose, Bld: 73 mg/dL (ref 65–99)
Potassium: 4 mmol/L (ref 3.5–5.3)
Sodium: 139 mmol/L (ref 135–146)
Total Bilirubin: 0.3 mg/dL (ref 0.2–1.2)
Total Protein: 7 g/dL (ref 6.1–8.1)

## 2019-07-09 LAB — CBC WITH DIFFERENTIAL/PLATELET
Absolute Monocytes: 640 cells/uL (ref 200–950)
Basophils Absolute: 47 cells/uL (ref 0–200)
Basophils Relative: 0.6 %
Eosinophils Absolute: 103 cells/uL (ref 15–500)
Eosinophils Relative: 1.3 %
HCT: 41.7 % (ref 35.0–45.0)
Hemoglobin: 13.4 g/dL (ref 11.7–15.5)
Lymphs Abs: 2291 cells/uL (ref 850–3900)
MCH: 28.1 pg (ref 27.0–33.0)
MCHC: 32.1 g/dL (ref 32.0–36.0)
MCV: 87.4 fL (ref 80.0–100.0)
MPV: 10.7 fL (ref 7.5–12.5)
Monocytes Relative: 8.1 %
Neutro Abs: 4819 cells/uL (ref 1500–7800)
Neutrophils Relative %: 61 %
Platelets: 263 10*3/uL (ref 140–400)
RBC: 4.77 10*6/uL (ref 3.80–5.10)
RDW: 14.7 % (ref 11.0–15.0)
Total Lymphocyte: 29 %
WBC: 7.9 10*3/uL (ref 3.8–10.8)

## 2019-07-09 LAB — LIPID PANEL
Cholesterol: 189 mg/dL (ref <200)
HDL: 47 mg/dL — ABNORMAL LOW (ref >=50)
LDL Cholesterol (Calc): 109 mg/dL (calc) — ABNORMAL HIGH
Non-HDL Cholesterol (Calc): 142 mg/dL (calc) — ABNORMAL HIGH (ref <130)
Total CHOL/HDL Ratio: 4 (calc) (ref <5.0)
Triglycerides: 212 mg/dL — ABNORMAL HIGH (ref <150)

## 2019-07-09 LAB — HEMOGLOBIN A1C
Hgb A1c MFr Bld: 5.3 % of total Hgb (ref <5.7)
Mean Plasma Glucose: 105 (calc)
eAG (mmol/L): 5.8 (calc)

## 2019-07-09 LAB — VITAMIN D 25 HYDROXY (VIT D DEFICIENCY, FRACTURES): Vit D, 25-Hydroxy: 14 ng/mL — ABNORMAL LOW (ref 30–100)

## 2019-07-09 LAB — TSH: TSH: 2.07 mIU/L

## 2019-07-18 ENCOUNTER — Encounter: Payer: Self-pay | Admitting: *Deleted

## 2019-09-02 ENCOUNTER — Encounter: Payer: Self-pay | Admitting: Family Medicine

## 2019-10-07 ENCOUNTER — Ambulatory Visit: Admit: 2019-10-07 | Disposition: A | Discharge status: Home / Self Care | Payer: 59

## 2020-05-28 ENCOUNTER — Telehealth: Payer: Self-pay

## 2020-05-28 NOTE — Telephone Encounter (Signed)
That is fine, thanks 

## 2020-05-28 NOTE — Telephone Encounter (Signed)
Patient wanted to see if she can get re established with Dr Milinda Antis. Patient use to see Dr Milinda Antis for a few years and then last time when she had to come in she was scheduled to get established with Eunice Blase in June 2021. Patient is needing to be set up for a CPE-labs, pap, etc. Patient said Dr Milinda Antis mentioned before to her that she would be willing to take her but was not sure if that was still ok. Please advise. Thank you

## 2020-06-06 NOTE — Telephone Encounter (Signed)
Patient scheduled on 6/17.

## 2020-07-06 ENCOUNTER — Other Ambulatory Visit (HOSPITAL_COMMUNITY)
Admission: RE | Admit: 2020-07-06 | Discharge: 2020-07-06 | Disposition: A | Discharge status: Home / Self Care | Payer: 59 | Source: Ambulatory Visit | Attending: Family Medicine | Admitting: Family Medicine

## 2020-07-06 ENCOUNTER — Other Ambulatory Visit: Payer: Self-pay

## 2020-07-06 ENCOUNTER — Ambulatory Visit (INDEPENDENT_AMBULATORY_CARE_PROVIDER_SITE_OTHER): Payer: 59 | Admitting: Family Medicine

## 2020-07-06 ENCOUNTER — Encounter: Payer: Self-pay | Admitting: Family Medicine

## 2020-07-06 VITALS — BP 120/76 | HR 85 | Temp 97.5°F | Ht 64.0 in | Wt 246.3 lb

## 2020-07-06 DIAGNOSIS — Z01419 Encounter for gynecological examination (general) (routine) without abnormal findings: Secondary | ICD-10-CM | POA: Diagnosis not present

## 2020-07-06 DIAGNOSIS — E559 Vitamin D deficiency, unspecified: Secondary | ICD-10-CM | POA: Insufficient documentation

## 2020-07-06 DIAGNOSIS — J301 Allergic rhinitis due to pollen: Secondary | ICD-10-CM

## 2020-07-06 DIAGNOSIS — Z23 Encounter for immunization: Secondary | ICD-10-CM | POA: Diagnosis not present

## 2020-07-06 DIAGNOSIS — Z0000 Encounter for general adult medical examination without abnormal findings: Secondary | ICD-10-CM | POA: Insufficient documentation

## 2020-07-06 DIAGNOSIS — J309 Allergic rhinitis, unspecified: Secondary | ICD-10-CM | POA: Insufficient documentation

## 2020-07-06 NOTE — Progress Notes (Signed)
Subjective:    Patient ID: Rachael Mora, female    DOB: Jun 19, 1980, 40 y.o.   MRN: [id number]  This visit occurred during the SARS-CoV-2 public health emergency.  Safety protocols were in place, including screening questions prior to the visit, additional usage of staff PPE, and extensive cleaning of exam room while observing appropriate contact time as indicated for disinfecting solutions.   HPI 40 yo pt of NP Leone Payor presents to transfer care   Wt Readings from Last 3 Encounters:  07/06/20 246 lb 4.8 oz (111.7 kg)  07/08/19 228 lb (103.4 kg)  06/01/19 230 lb (104.3 kg)   42.28 kg/m  Feeling great overall   Thinks she is allergic to hot pepper  She can eat a mildly hot thing and she sweats/tastes severely hot  Lips swell  Hives from mango in the past  Seasonal allergies also  Occ claritin or zyrtec or flonase   Covid status - 2020 ,got over it ok  Had one vaccine , not planning on a next one  Occ gets flu shots    Td 11/07 Will update today  Pap 9/20 -neg with pos HPV screen Due for that  Did not remember having a f/u for that pap  No problems with menses  Not too heavy or painful   No need for contraception  Does not desire pregnancy    [facility]    H/o hyperlipidemia Lab Results  Component Value Date   CHOL 189 07/08/2019   HDL 47 (L) 07/08/2019   LDLCALC 109 (H) 07/08/2019   TRIG 212 (H) 07/08/2019   CHOLHDL 4.0 07/08/2019   H/o low vit D  Level of 14 a year ago  She took vitamin D for a while - ? How much   Patient Active Problem List   Diagnosis Date Noted   Morbid obesity (HCC) 07/06/2020   Vitamin D deficiency 07/06/2020   Routine general medical examination at a health care facility 07/06/2020   Encounter for gynecological examination (general) (routine) without abnormal findings 07/06/2020   Allergic rhinitis 07/06/2020   CERVICAL STRAIN 03/04/2007   HYPERHIDROSIS 12/15/2006   HYPERLIPIDEMIA 12/14/2006    MIGRAINES, HX OF 12/14/2006   Past Medical History:  Diagnosis Date   Anal fissure    Frequent headaches    Vesicoureteral reflux    Past Surgical History:  Procedure Laterality Date   MMK / ANTERIOR VESICOURETHROPEXY / URETHROPEXY Left 1992   Left kidney @ [facility]   Social History   Tobacco Use   Smoking status: Former    Pack years: 0.00   Smokeless tobacco: Never  Vaping Use   Vaping Use: Never used  Substance Use Topics   Alcohol use: Yes    Comment: Social    Drug use: Never   Family History  Adopted: Yes  Problem Relation Age of Onset   Chronic bronchitis Mother    Hyperthyroidism Mother    No Known Allergies No current outpatient medications on file prior to visit.   No current facility-administered medications on file prior to visit.     Review of Systems  Constitutional:  Negative for activity change, appetite change, fatigue, fever and unexpected weight change.  HENT:  Positive for rhinorrhea. Negative for congestion, ear pain, sinus pressure and sore throat.        Possible allergic rxn to some foods  Eyes:  Negative for pain, redness and visual disturbance.  Respiratory:  Negative for cough, shortness  of breath and wheezing.   Cardiovascular:  Negative for chest pain and palpitations.  Gastrointestinal:  Negative for abdominal pain, blood in stool, constipation and diarrhea.  Endocrine: Negative for polydipsia and polyuria.  Genitourinary:  Negative for dysuria, frequency and urgency.  Musculoskeletal:  Negative for arthralgias, back pain and myalgias.  Skin:  Negative for pallor and rash.  Allergic/Immunologic: Negative for environmental allergies.  Neurological:  Negative for dizziness, syncope and headaches.  Hematological:  Negative for adenopathy. Does not bruise/bleed easily.  Psychiatric/Behavioral:  Negative for decreased concentration and dysphoric mood. The patient is not nervous/anxious.       Objective:   Physical Exam Constitutional:       General: She is not in acute distress.    Appearance: Normal appearance. She is well-developed. She is obese. She is not ill-appearing or diaphoretic.  HENT:     Head: Normocephalic and atraumatic.     Right Ear: Tympanic membrane, ear canal and external ear normal.     Left Ear: Tympanic membrane, ear canal and external ear normal.     Nose: Nose normal. No congestion.     Mouth/Throat:     Mouth: Mucous membranes are moist.     Pharynx: Oropharynx is clear. No posterior oropharyngeal erythema.  Eyes:     General: No scleral icterus.    Extraocular Movements: Extraocular movements intact.     Conjunctiva/sclera: Conjunctivae normal.     Pupils: Pupils are equal, round, and reactive to light.  Neck:     Thyroid: No thyromegaly.     Vascular: No carotid bruit or JVD.  Cardiovascular:     Rate and Rhythm: Normal rate and regular rhythm.     Pulses: Normal pulses.     Heart sounds: Normal heart sounds.    No gallop.  Pulmonary:     Effort: Pulmonary effort is normal. No respiratory distress.     Breath sounds: Normal breath sounds. No wheezing.     Comments: Good air exch Chest:     Chest wall: No tenderness.  Abdominal:     General: Bowel sounds are normal. There is no distension or abdominal bruit.     Palpations: Abdomen is soft. There is no mass.     Tenderness: There is no abdominal tenderness.     Hernia: No hernia is present.  Genitourinary:    Comments: Breast exam: No mass, nodules, thickening, tenderness, bulging, retraction, inflamation, nipple discharge or skin changes noted.  No axillary or clavicular LA.                  Anus appears normal w/o hemorrhoids or masses       External genitalia : nl appearance and hair distribution/no lesions       Urethral meatus : nl size, no lesions or prolapse       Urethra: no masses, tenderness or scarring      Bladder : no masses or tenderness       Vagina: nl general appearance, no discharge or  Lesions, no significant  cystocele  or rectocele       Cervix: no lesions/ discharge or friability  (is posterior)       Uterus: nl size, contour, position, and mobility (not fixed) , non tender      Adnexa : no masses, tenderness, enlargement or nodularity            Musculoskeletal:        General: No tenderness. Normal range of motion.  Cervical back: Normal range of motion and neck supple. No rigidity. No muscular tenderness.     Right lower leg: No edema.     Left lower leg: No edema.  Lymphadenopathy:     Cervical: No cervical adenopathy.  Skin:    General: Skin is warm and dry.     Coloration: Skin is not pale.     Findings: No erythema or rash.     Comments: Solar lentigines diffusely   Neurological:     Mental Status: She is alert. Mental status is at baseline.     Cranial Nerves: No cranial nerve deficit.     Motor: No abnormal muscle tone.     Coordination: Coordination normal.     Gait: Gait normal.     Deep Tendon Reflexes: Reflexes are normal and symmetric. Reflexes normal.  Psychiatric:        Mood and Affect: Mood normal.        Cognition and Memory: Cognition and memory normal.          Assessment & Plan:   Problem List Items Addressed This Visit       Respiratory   Allergic rhinitis    Considering an allergy referral for this and also ? Food allergy         Other   Morbid obesity (HCC)    Discussed how this problem influences overall health and the risks it imposes  Reviewed plan for weight loss with lower calorie diet (via better food choices and also portion control or program like weight watchers) and exercise building up to or more than 30 minutes 5 days per week including some aerobic activity          Vitamin D deficiency    Low in the past- she took some vit D short term  Labs today and will advise       Relevant Orders   VITAMIN D 25 Hydroxy (Vit-D Deficiency, Fractures) (Completed)   Routine general medical examination at a health care facility -  Primary    Reviewed health habits including diet and exercise and skin cancer prevention Reviewed appropriate screening tests for age  Also reviewed health mt list, fam hx and immunization status , as well as social and family history   See HPI Labs today  Routine gyn exam today with pap inst pt to call in the fall for referral for first mammogram when she turns 40 Offered to answer questions re: covid vaccine  Tdap given         Relevant Orders   CBC with Differential/Platelet (Completed)   Comprehensive metabolic panel (Completed)   Lipid panel (Completed)   TSH (Completed)   Encounter for gynecological examination (general) (routine) without abnormal findings    Routine gyn exam  Note last pap has pos HPV and she did not f/u with gyn for that  Pap done today  No gyn complaints or menstrual problems        Relevant Orders   Cytology - PAP([facility])   Other Visit Diagnoses     Need for Tdap vaccination       Relevant Orders   Tdap vaccine greater than or equal to 7yo IM (Completed)

## 2020-07-06 NOTE — Patient Instructions (Addendum)
Call us in the fall to get a mammogram referral   Pap today   We can consider an allergist referral   Labs today   For general health  Avoid red meat/ fried foods/ egg yolks/ fatty breakfast meats/ butter, cheese and high fat dairy/ and shellfish   Try to get most of your carbohydrates from produce (with the exception of white potatoes)  Eat less bread/pasta/rice/snack foods/cereals/sweets and other items from the middle of the grocery store (processed carbs)  Tdap vaccine today

## 2020-07-07 LAB — CBC WITH DIFFERENTIAL/PLATELET
Absolute Monocytes: 689 cells/uL (ref 200–950)
Basophils Absolute: 41 cells/uL (ref 0–200)
Basophils Relative: 0.5 %
Eosinophils Absolute: 131 cells/uL (ref 15–500)
Eosinophils Relative: 1.6 %
HCT: 42.8 % (ref 35.0–45.0)
Hemoglobin: 14 g/dL (ref 11.7–15.5)
Lymphs Abs: 2476 cells/uL (ref 850–3900)
MCH: 28.9 pg (ref 27.0–33.0)
MCHC: 32.7 g/dL (ref 32.0–36.0)
MCV: 88.4 fL (ref 80.0–100.0)
MPV: 10.9 fL (ref 7.5–12.5)
Monocytes Relative: 8.4 %
Neutro Abs: 4863 cells/uL (ref 1500–7800)
Neutrophils Relative %: 59.3 %
Platelets: 259 10*3/uL (ref 140–400)
RBC: 4.84 10*6/uL (ref 3.80–5.10)
RDW: 13.9 % (ref 11.0–15.0)
Total Lymphocyte: 30.2 %
WBC: 8.2 10*3/uL (ref 3.8–10.8)

## 2020-07-07 LAB — COMPREHENSIVE METABOLIC PANEL
AG Ratio: 1.4 (calc) (ref 1.0–2.5)
ALT: 16 U/L (ref 6–29)
AST: 15 U/L (ref 10–30)
Albumin: 4 g/dL (ref 3.6–5.1)
Alkaline phosphatase (APISO): 61 U/L (ref 31–125)
BUN: 13 mg/dL (ref 7–25)
CO2: 24 mmol/L (ref 20–32)
Calcium: 9.1 mg/dL (ref 8.6–10.2)
Chloride: 103 mmol/L (ref 98–110)
Creat: 0.85 mg/dL (ref 0.50–1.10)
Globulin: 2.9 g/dL (calc) (ref 1.9–3.7)
Glucose, Bld: 84 mg/dL (ref 65–99)
Potassium: 4.2 mmol/L (ref 3.5–5.3)
Sodium: 138 mmol/L (ref 135–146)
Total Bilirubin: 0.3 mg/dL (ref 0.2–1.2)
Total Protein: 6.9 g/dL (ref 6.1–8.1)

## 2020-07-07 LAB — LIPID PANEL
Cholesterol: 184 mg/dL (ref <200)
HDL: 41 mg/dL — ABNORMAL LOW (ref >=50)
LDL Cholesterol (Calc): 94 mg/dL (calc)
Non-HDL Cholesterol (Calc): 143 mg/dL (calc) — ABNORMAL HIGH (ref <130)
Total CHOL/HDL Ratio: 4.5 (calc) (ref <5.0)
Triglycerides: 371 mg/dL — ABNORMAL HIGH (ref <150)

## 2020-07-07 LAB — VITAMIN D 25 HYDROXY (VIT D DEFICIENCY, FRACTURES): Vit D, 25-Hydroxy: 19 ng/mL — ABNORMAL LOW (ref 30–100)

## 2020-07-07 LAB — TSH: TSH: 1.92 mIU/L

## 2020-07-08 NOTE — Assessment & Plan Note (Signed)
Discussed how this problem influences overall health and the risks it imposes  Reviewed plan for weight loss with lower calorie diet (via better food choices and also portion control or program like weight watchers) and exercise building up to or more than 30 minutes 5 days per week including some aerobic activity    

## 2020-07-08 NOTE — Assessment & Plan Note (Signed)
Low in the past- she took some vit D short term  Labs today and will advise

## 2020-07-08 NOTE — Assessment & Plan Note (Signed)
Routine gyn exam  Note last pap has pos HPV and she did not f/u with gyn for that  Pap done today  No gyn complaints or menstrual problems

## 2020-07-08 NOTE — Assessment & Plan Note (Signed)
Disc goals for lipids and reasons to control them Rev last labs with pt Rev low sat fat diet in detail Labs today   

## 2020-07-08 NOTE — Assessment & Plan Note (Signed)
Considering an allergy referral for this and also ? Food allergy

## 2020-07-08 NOTE — Assessment & Plan Note (Signed)
Reviewed health habits including diet and exercise and skin cancer prevention Reviewed appropriate screening tests for age  Also reviewed health mt list, fam hx and immunization status , as well as social and family history   See HPI Labs today  Routine gyn exam today with pap inst pt to call in the fall for referral for first mammogram when she turns 40 Offered to answer questions re: covid vaccine  Tdap given

## 2020-07-10 ENCOUNTER — Telehealth: Payer: Self-pay | Admitting: Family Medicine

## 2020-07-10 DIAGNOSIS — R220 Localized swelling, mass and lump, head: Secondary | ICD-10-CM | POA: Insufficient documentation

## 2020-07-10 DIAGNOSIS — J301 Allergic rhinitis due to pollen: Secondary | ICD-10-CM

## 2020-07-10 NOTE — Telephone Encounter (Signed)
Patient called and informed that Dr. Milinda Antis placed a referral for her to be seen by the allergists. She stated that she understood and will await their call to set up an appointment.

## 2020-07-10 NOTE — Telephone Encounter (Signed)
Please let pt know I placed the allergist referral so she will get a call

## 2020-07-11 LAB — CYTOLOGY - PAP
Adequacy: ABSENT
Comment: NEGATIVE
Diagnosis: NEGATIVE
High risk HPV: NEGATIVE

## 2020-07-16 ENCOUNTER — Encounter: Payer: Self-pay | Admitting: *Deleted

## 2020-07-16 ENCOUNTER — Telehealth: Payer: Self-pay | Admitting: Family Medicine

## 2020-07-16 MED ORDER — ERGOCALCIFEROL 1.25 MG (50000 UT) PO CAPS: 50000.0000 [IU] | ORAL_CAPSULE | ORAL | 0 refills | Status: DC

## 2020-07-16 NOTE — Telephone Encounter (Signed)
I sent it  

## 2020-07-16 NOTE — Addendum Note (Signed)
Addended by: Roxy Manns A on: 07/16/2020 02:55 PM   Modules accepted: Orders

## 2020-07-16 NOTE — Telephone Encounter (Signed)
Pt called stating she was supposed to have a prescription for Vitamin D sent last week to [facility] on [address]. She called the pharmacy as she never received a call stating it was ready and they stated they never received anything from our office. Please advise.

## 2020-07-16 NOTE — Telephone Encounter (Signed)
Pt notified Rx sent to pharmacy

## 2020-08-09 ENCOUNTER — Encounter: Payer: Self-pay | Admitting: Family Medicine

## 2020-08-13 MED ORDER — NORGESTIM-ETH ESTRAD TRIPHASIC 0.18/0.215/0.25 MG-25 MCG PO TABS: 1.0000 | ORAL_TABLET | Freq: Every day | ORAL | 11 refills | Status: DC

## 2020-10-18 ENCOUNTER — Other Ambulatory Visit: Payer: Self-pay | Admitting: Family Medicine

## 2020-11-15 ENCOUNTER — Telehealth: Payer: Self-pay | Admitting: Family Medicine

## 2020-11-15 NOTE — Telephone Encounter (Signed)
Pt was told to call back after she turns 40 to get a mammogram referral from provider. Pt said she will make an appt if she needs to but does not know if she has to make one

## 2020-11-15 NOTE — Telephone Encounter (Signed)
Great, ask where she wants to go and I will put in an order, then she can call to schedule. (Have to order the first one no matter where she goes)

## 2020-11-15 NOTE — Telephone Encounter (Signed)
Left message for patient to call back. Need to know [facility] or [facility]. Below are the offices of places we usually use with [facility] to send patients. Patient will need to call and schedule it herself once the order is in.  [facility] Address: First Floor - [facility], [address] [place], [place], [place] [address] Phone: [phone]  [facility] Address: [address], [place], [place] [address] Phone: [phone]

## 2020-11-22 NOTE — Telephone Encounter (Signed)
Left 2nd VM requesting pt to call us back regarding where she wants mammogram order sent to

## 2020-11-27 NOTE — Telephone Encounter (Signed)
Left v/m requesting cb. 

## 2020-12-04 NOTE — Telephone Encounter (Signed)
We left 3 VM regarding this pt never responded so letter mailed requesting pt to call us back regarding this

## 2020-12-20 ENCOUNTER — Other Ambulatory Visit: Payer: Self-pay | Admitting: Family Medicine

## 2021-01-24 ENCOUNTER — Encounter: Payer: Self-pay | Admitting: Family Medicine

## 2021-01-24 ENCOUNTER — Other Ambulatory Visit: Payer: Self-pay | Admitting: Family Medicine

## 2021-01-24 DIAGNOSIS — Z1231 Encounter for screening mammogram for malignant neoplasm of breast: Secondary | ICD-10-CM | POA: Insufficient documentation

## 2021-01-24 MED ORDER — ERGOCALCIFEROL 1.25 MG (50000 UT) PO CAPS: 50000.0000 [IU] | ORAL_CAPSULE | ORAL | 0 refills | Status: DC

## 2021-01-24 NOTE — Telephone Encounter (Signed)
Looks like it was a one time Rx filled for 3 months on 07/16/20, please advise

## 2021-02-05 ENCOUNTER — Telehealth (INDEPENDENT_AMBULATORY_CARE_PROVIDER_SITE_OTHER): Payer: 59 | Admitting: Family Medicine

## 2021-02-05 ENCOUNTER — Other Ambulatory Visit: Payer: Self-pay

## 2021-02-05 ENCOUNTER — Encounter: Payer: Self-pay | Admitting: Family Medicine

## 2021-02-05 DIAGNOSIS — R69 Illness, unspecified: Secondary | ICD-10-CM | POA: Diagnosis not present

## 2021-02-05 DIAGNOSIS — F430 Acute stress reaction: Secondary | ICD-10-CM | POA: Diagnosis not present

## 2021-02-05 MED ORDER — BUSPIRONE HCL 15 MG PO TABS: 7.5000 mg | ORAL_TABLET | Freq: Two times a day (BID) | ORAL | 3 refills | Status: DC

## 2021-02-05 NOTE — Progress Notes (Signed)
Virtual Visit via Video Note  I connected with Rachael Mora on 02/05/21 at  4:00 PM EST by a video enabled telemedicine application and verified that I am speaking with the correct person using two identifiers.  Location: Patient: home Provider: office    I discussed the limitations of evaluation and management by telemedicine and the availability of in person appointments. The patient expressed understanding and agreed to proceed.  Parties involved in encounter  Patient: Rachael Mora   Provider:  Roxy Manns MD   History of Present Illness: Pt presents to discuss anxiety   Wt Readings from Last 3 Encounters:  02/05/21 252 lb (114.3 kg)  07/06/20 246 lb 4.8 oz (111.7 kg)  07/08/19 228 lb (103.4 kg)   43.26 kg/m  Has had a lot of stressors lately -family stuff Mother has alzheimer' dementia  5-6 big issues at one time  Affects her work  Tired and not motivated  (not sad just unmotivated)   She scheduled therapy through better [facility] on line  Plans to start it on Thursday   ? If needs medication   Never had mental health problems in the past  Very easy going until now   Anxiety  Frustrated with people/irritability -not like her  Family and friends notice it  Occ wakes up in the am with a panicky feeling  (? If she has ever had a panic attack)   Hard to go to sleep at night  Mind bounces all over   Not hopeless   She tries to work out/walk when she can fit it in  Sometimes meditation   She talks to friends in a guarded manner/does not want to be judged   No abuse in her life   Takes OC  Was taking it continuously for some trips  Thinks it may make her sweat  Wants to hold that for now    Patient Active Problem List   Diagnosis Date Noted   Stress reaction 02/05/2021   Encounter for screening mammogram for breast cancer 01/24/2021   Lip swelling 07/10/2020   Morbid obesity (HCC) 07/06/2020   Vitamin D deficiency 07/06/2020   Routine general  medical examination at a health care facility 07/06/2020   Encounter for gynecological examination (general) (routine) without abnormal findings 07/06/2020   Allergic rhinitis 07/06/2020   CERVICAL STRAIN 03/04/2007   HYPERHIDROSIS 12/15/2006   HYPERLIPIDEMIA 12/14/2006   MIGRAINES, HX OF 12/14/2006   Past Medical History:  Diagnosis Date   Anal fissure    Frequent headaches    Vesicoureteral reflux    Past Surgical History:  Procedure Laterality Date   MMK / ANTERIOR VESICOURETHROPEXY / URETHROPEXY Left 1992   Left kidney @ [facility]   Social History   Tobacco Use   Smoking status: Former   Smokeless tobacco: Never  Building services engineer Use: Never used  Substance Use Topics   Alcohol use: Yes    Comment: Social    Drug use: Never   Family History  Adopted: Yes  Problem Relation Age of Onset   Chronic bronchitis Mother    Hyperthyroidism Mother    No Known Allergies Current Outpatient Medications on File Prior to Visit  Medication Sig Dispense Refill   ergocalciferol (VITAMIN D2) 1.25 MG (50000 UT) capsule Take 1 capsule (50,000 Units total) by mouth once a week. 12 capsule 0   Norgestimate-Ethinyl Estradiol Triphasic (ORTHO TRI-CYCLEN LO) 0.18/0.215/0.25 MG-25 MCG tab Take 1 tablet by mouth daily. 28 tablet 11  No current facility-administered medications on file prior to visit.   Review of Systems  Constitutional:  Negative for chills, fever and malaise/fatigue.  HENT:  Negative for congestion, ear pain, sinus pain and sore throat.   Eyes:  Negative for blurred vision, discharge and redness.  Respiratory:  Negative for cough, shortness of breath and stridor.   Cardiovascular:  Negative for chest pain, palpitations and leg swelling.  Gastrointestinal:  Negative for abdominal pain, diarrhea, nausea and vomiting.  Musculoskeletal:  Negative for myalgias.  Skin:  Negative for rash.  Neurological:  Negative for dizziness and headaches.  Psychiatric/Behavioral:  Negative  for substance abuse and suicidal ideas. The patient is nervous/anxious and has insomnia.        Feels unmotivated but not depressed   Observations/Objective: Patient appears well, in no distress Weight is baseline  No facial swelling or asymmetry Normal voice-not hoarse and no slurred speech No obvious tremor or mobility impairment Moving neck and UEs normally Able to hear the call well  No cough or shortness of breath during interview  Talkative and mentally sharp with no cognitive changes No skin changes on face or neck , no rash or pallor Mildly anxious, candidly discusses stressors and symptoms  Not tearful  Assessment and Plan: Problem List Items Addressed This Visit       Other   Stress reaction    Multiple stressors including mother with alz dementia  Good insight  Starting counseling on Thursday Reviewed stressors/ coping techniques/symptoms/ support sources/ tx options and side effects in detail today  Enc exercise for coping tech and writing in a journal  Discussed meditation/mindfullness options  Px buspar 7.5 mg bid Discussed expectations of this medication including time to effectiveness and mechanism of action, also poss of side effects (early and late)- including mental fuzziness, weight or appetite change, nausea and poss of worse dep or anxiety (even suicidal thoughts)  Pt voiced understanding and will stop med and update if this occurs  Will update  F/u 4-6 wk of earlier if needed        Relevant Medications   busPIRone (BUSPAR) 15 MG tablet    Follow Up Instructions: Go ahead and start therapy as planned   Think about using a journal Also meditation apps    A helpful book about caring for people with dementia is called " the 36 hour day"  Start buspar and let us know if any intolerable side effects or if you feel worse   Try to fit in exercise when you can Schedule a follow up in 4-6 weeks or earlier if needed    I discussed the assessment and  treatment plan with the patient. The patient was provided an opportunity to ask questions and all were answered. The patient agreed with the plan and demonstrated an understanding of the instructions.   The patient was advised to call back or seek an in-person evaluation if the symptoms worsen or if the condition fails to improve as anticipated.     Roxy Manns, MD

## 2021-02-05 NOTE — Patient Instructions (Signed)
Go ahead and start therapy as planned   Think about using a journal Also meditation apps    A helpful book about caring for people with dementia is called " the 36 hour day"  Start buspar and let us know if any intolerable side effects or if you feel worse   Try to fit in exercise when you can Schedule a follow up in 4-6 weeks or earlier if needed

## 2021-02-05 NOTE — Assessment & Plan Note (Signed)
Multiple stressors including mother with alz dementia  Good insight  Starting counseling on Thursday Reviewed stressors/ coping techniques/symptoms/ support sources/ tx options and side effects in detail today  Enc exercise for coping tech and writing in a journal  Discussed meditation/mindfullness options  Px buspar 7.5 mg bid Discussed expectations of this medication including time to effectiveness and mechanism of action, also poss of side effects (early and late)- including mental fuzziness, weight or appetite change, nausea and poss of worse dep or anxiety (even suicidal thoughts)  Pt voiced understanding and will stop med and update if this occurs  Will update  F/u 4-6 wk of earlier if needed

## 2021-03-07 ENCOUNTER — Encounter: Payer: Self-pay | Admitting: Family Medicine

## 2021-03-29 ENCOUNTER — Other Ambulatory Visit: Payer: Self-pay | Admitting: Family Medicine

## 2021-03-29 ENCOUNTER — Ambulatory Visit (INDEPENDENT_AMBULATORY_CARE_PROVIDER_SITE_OTHER): Payer: 59 | Admitting: Family Medicine

## 2021-03-29 ENCOUNTER — Other Ambulatory Visit: Payer: Self-pay

## 2021-03-29 ENCOUNTER — Encounter: Payer: Self-pay | Admitting: Family Medicine

## 2021-03-29 DIAGNOSIS — F430 Acute stress reaction: Secondary | ICD-10-CM | POA: Diagnosis not present

## 2021-03-29 DIAGNOSIS — R69 Illness, unspecified: Secondary | ICD-10-CM | POA: Diagnosis not present

## 2021-03-29 DIAGNOSIS — N898 Other specified noninflammatory disorders of vagina: Secondary | ICD-10-CM | POA: Insufficient documentation

## 2021-03-29 MED ORDER — METRONIDAZOLE 500 MG PO TABS: 500.0000 mg | ORAL_TABLET | Freq: Two times a day (BID) | ORAL | 0 refills | Status: AC

## 2021-03-29 NOTE — Progress Notes (Signed)
? ?Subjective:  ? ? Patient ID: Rachael Mora, female    DOB: 11-05-1980, 41 y.o.   MRN: [id number] ? ?This visit occurred during the SARS-CoV-2 public health emergency.  Safety protocols were in place, including screening questions prior to the visit, additional usage of staff PPE, and extensive cleaning of exam room while observing appropriate contact time as indicated for disinfecting solutions.  ? ?HPI ?Pt presents for c/o vaginal discharge ? ?Wt Readings from Last 3 Encounters:  ?03/29/21 254 lb (115.2 kg)  ?02/05/21 252 lb (114.3 kg)  ?07/06/20 246 lb 4.8 oz (111.7 kg)  ? ?43.60 kg/m? ? ? ?Discharge is a little different     (wore a tampon)  ?Some odor  ?No itching or burning or pain  ?No recent abx  ? ?No worries about STDs  ? ? ?Pap nl 06/2020 with neg HPV ? ? ?Contraception  ?Ortho tri cyclen  ?Period has been weird- more spotting since December (? If due to buspar or stress)  ?No missed pills  ? ?Results for orders placed or performed in visit on 03/29/21  ?POCT Wet Prep [facility])  ?Result Value Ref Range  ? Source Wet Prep POC vaginal   ? WBC, Wet Prep HPF POC few   ? Bacteria Wet Prep HPF POC Few Few  ? BACTERIA WET PREP MORPHOLOGY POC    ? Clue Cells Wet Prep HPF POC Many (A) None  ? Clue Cells Wet Prep Whiff POC    ? Yeast Wet Prep HPF POC None None  ? KOH Wet Prep POC Few (A) None  ? Trichomonas Wet Prep HPF POC Absent Absent  ?  ? ?Last night was last buspar pill  ?Worried about side effects  ?Felt a little dizzy   ?Made her more irritable and a little panicy in ams  ? ?Patient Active Problem List  ? Diagnosis Date Noted  ? Vaginal discharge 03/29/2021  ? Stress reaction 02/05/2021  ? Encounter for screening mammogram for breast cancer 01/24/2021  ? Lip swelling 07/10/2020  ? Morbid obesity (HCC) 07/06/2020  ? Vitamin D deficiency 07/06/2020  ? Routine general medical examination at a health care facility 07/06/2020  ? Encounter for gynecological examination (general) (routine) without abnormal  findings 07/06/2020  ? Allergic rhinitis 07/06/2020  ? CERVICAL STRAIN 03/04/2007  ? HYPERHIDROSIS 12/15/2006  ? HYPERLIPIDEMIA 12/14/2006  ? MIGRAINES, HX OF 12/14/2006  ? ?Past Medical History:  ?Diagnosis Date  ? Anal fissure   ? Frequent headaches   ? Vesicoureteral reflux   ? ?Past Surgical History:  ?Procedure Laterality Date  ? MMK / ANTERIOR VESICOURETHROPEXY / URETHROPEXY Left 1992  ? Left kidney @ [facility]  ? ?Social History  ? ?Tobacco Use  ? Smoking status: Former  ? Smokeless tobacco: Never  ?Vaping Use  ? Vaping Use: Never used  ?Substance Use Topics  ? Alcohol use: Yes  ?  Comment: Social   ? Drug use: Never  ? ?Family History  ?Adopted: Yes  ?Problem Relation Age of Onset  ? Chronic bronchitis Mother   ? Hyperthyroidism Mother   ? ?No Known Allergies ?Current Outpatient Medications on File Prior to Visit  ?Medication Sig Dispense Refill  ? ergocalciferol (VITAMIN D2) 1.25 MG (50000 UT) capsule Take 1 capsule (50,000 Units total) by mouth once a week. 12 capsule 0  ? ?No current facility-administered medications on file prior to visit.  ?  ? ?Review of Systems  ?Constitutional:  Negative for activity change, appetite change, fatigue,  fever and unexpected weight change.  ?HENT:  Negative for congestion, ear pain, rhinorrhea, sinus pressure and sore throat.   ?Eyes:  Negative for pain, redness and visual disturbance.  ?Respiratory:  Negative for cough, shortness of breath and wheezing.   ?Cardiovascular:  Negative for chest pain and palpitations.  ?Gastrointestinal:  Negative for abdominal pain, blood in stool, constipation and diarrhea.  ?Endocrine: Negative for polydipsia and polyuria.  ?Genitourinary:  Positive for vaginal discharge. Negative for dysuria, frequency, urgency, vaginal bleeding and vaginal pain.  ?Musculoskeletal:  Negative for arthralgias, back pain and myalgias.  ?Skin:  Negative for pallor and rash.  ?Allergic/Immunologic: Negative for environmental allergies.  ?Neurological:  Negative  for dizziness, syncope and headaches.  ?Hematological:  Negative for adenopathy. Does not bruise/bleed easily.  ?Psychiatric/Behavioral:  Negative for decreased concentration and dysphoric mood. The patient is nervous/anxious.   ? ?   ?Objective:  ? Physical Exam ?Constitutional:   ?   General: She is not in acute distress. ?   Appearance: Normal appearance. She is obese. She is not ill-appearing or diaphoretic.  ?Eyes:  ?   General:     ?   Right eye: No discharge.     ?   Left eye: No discharge.  ?   Conjunctiva/sclera: Conjunctivae normal.  ?   Pupils: Pupils are equal, round, and reactive to light.  ?Cardiovascular:  ?   Rate and Rhythm: Normal rate and regular rhythm.  ?Pulmonary:  ?   Effort: No respiratory distress.  ?Abdominal:  ?   General: Abdomen is flat. Bowel sounds are normal. There is no distension.  ?   Palpations: Abdomen is soft. There is no mass.  ?   Tenderness: There is no abdominal tenderness.  ?   Comments: No suprapubic tenderness or fullness    ?Genitourinary: ?   Comments: Nl ext genitalia ?No lesions or erythema noted  ?No excoriation  ? ?Wet prep obtained  ? ?Musculoskeletal:  ?   Cervical back: Normal range of motion and neck supple.  ?Lymphadenopathy:  ?   Cervical: No cervical adenopathy.  ?Skin: ?   Findings: No erythema or rash.  ?Neurological:  ?   Mental Status: She is alert.  ?Psychiatric:     ?   Mood and Affect: Mood normal.  ? ? ? ? ? ?   ?Assessment & Plan:  ? ?Problem List Items Addressed This Visit   ? ?  ? Other  ? Stress reaction  ?  Not tolerating buspar (dizzy/irritable) ?inst to hold it  ?In counseling-commended and will continue this  ?Would like to hold off on more medication at this time  ?Will update  ?Encouraged good self care  ?  ?  ? Vaginal discharge  ?  No new partners or exposures  ?Clue cells on wet prep ?tx for BV with metronizadone  ?Adv to avoid etoh while taking it  ?Update if not starting to improve in a week or if worsening  ?Plan to continue current  OC ? ?Gc/chlamydia screen pending  ?Meds ordered this encounter  ?Medications  ? metroNIDAZOLE (FLAGYL) 500 MG tablet  ?  Sig: Take 1 tablet (500 mg total) by mouth 2 (two) times daily for 7 days.  ?  Dispense:  14 tablet  ?  Refill:  0  ? ? ?  ?  ? Relevant Orders  ? POCT Wet Prep [facility]) (Completed)  ? C. TRACHOMATIS/N. GONORRHOEAE RNA ([facility])  ? ? ?

## 2021-03-29 NOTE — Patient Instructions (Addendum)
Hold the buspar for now  ?If symptoms do not improve let us know  ? ?Take the generic flagyl as directed  ?Avoid alcohol when you take this  ? ?We will sent the gc and chlamydia tests  ? ?Yogurt or any other probiotic helps with this  ? ?

## 2021-03-30 LAB — C. TRACHOMATIS/N. GONORRHOEAE RNA
C. trachomatis RNA, TMA: NOT DETECTED
N. gonorrhoeae RNA, TMA: NOT DETECTED

## 2021-03-30 LAB — POCT WET PREP (WET MOUNT): Trichomonas Wet Prep HPF POC: ABSENT

## 2021-03-30 NOTE — Assessment & Plan Note (Signed)
No new partners or exposures  ?Clue cells on wet prep ?tx for BV with metronizadone  ?Adv to avoid etoh while taking it  ?Update if not starting to improve in a week or if worsening  ?Plan to continue current OC ? ?Gc/chlamydia screen pending  ?Meds ordered this encounter  ?Medications  ?? metroNIDAZOLE (FLAGYL) 500 MG tablet  ?  Sig: Take 1 tablet (500 mg total) by mouth 2 (two) times daily for 7 days.  ?  Dispense:  14 tablet  ?  Refill:  0  ? ? ?

## 2021-03-30 NOTE — Assessment & Plan Note (Signed)
Not tolerating buspar (dizzy/irritable) ?inst to hold it  ?In counseling-commended and will continue this  ?Would like to hold off on more medication at this time  ?Will update  ?Encouraged good self care  ?

## 2021-04-03 ENCOUNTER — Other Ambulatory Visit: Payer: Self-pay

## 2021-04-03 ENCOUNTER — Ambulatory Visit
Admission: RE | Admit: 2021-04-03 | Discharge: 2021-04-03 | Disposition: A | Discharge status: Home / Self Care | Payer: 59 | Source: Ambulatory Visit | Attending: Family Medicine | Admitting: Family Medicine

## 2021-04-03 DIAGNOSIS — Z1231 Encounter for screening mammogram for malignant neoplasm of breast: Secondary | ICD-10-CM | POA: Insufficient documentation

## 2021-09-02 ENCOUNTER — Encounter: Payer: Self-pay | Admitting: Family Medicine

## 2021-09-06 ENCOUNTER — Ambulatory Visit (INDEPENDENT_AMBULATORY_CARE_PROVIDER_SITE_OTHER): Payer: 59 | Admitting: Family Medicine

## 2021-09-06 ENCOUNTER — Encounter: Payer: Self-pay | Admitting: Family Medicine

## 2021-09-06 VITALS — BP 124/80 | HR 96 | Temp 98.1°F | Resp 16 | Ht 64.0 in | Wt 259.8 lb

## 2021-09-06 DIAGNOSIS — R8290 Unspecified abnormal findings in urine: Secondary | ICD-10-CM

## 2021-09-06 DIAGNOSIS — R350 Frequency of micturition: Secondary | ICD-10-CM | POA: Diagnosis not present

## 2021-09-06 DIAGNOSIS — N898 Other specified noninflammatory disorders of vagina: Secondary | ICD-10-CM

## 2021-09-06 DIAGNOSIS — Z113 Encounter for screening for infections with a predominantly sexual mode of transmission: Secondary | ICD-10-CM | POA: Diagnosis not present

## 2021-09-06 DIAGNOSIS — R232 Flushing: Secondary | ICD-10-CM | POA: Insufficient documentation

## 2021-09-06 LAB — POCT URINALYSIS DIP (CLINITEK)
Bilirubin, UA: NEGATIVE
Glucose, UA: NEGATIVE mg/dL
Ketones, POC UA: NEGATIVE mg/dL
Nitrite, UA: NEGATIVE
Spec Grav, UA: 1.01 (ref 1.010–1.025)
Urobilinogen, UA: 0.2 E.U./dL
pH, UA: 6 (ref 5.0–8.0)

## 2021-09-06 LAB — POCT WET PREP (WET MOUNT): Trichomonas Wet Prep HPF POC: ABSENT

## 2021-09-06 MED ORDER — METRONIDAZOLE 0.75 % VA GEL: 1.0000 | Freq: Every day | VAGINAL | 0 refills | Status: DC

## 2021-09-06 NOTE — Progress Notes (Unsigned)
Subjective:    Patient ID: Rachael Mora, female    DOB: August 26, 1980, 41 y.o.   MRN: [id number]  HPI Pt presents for urinary/vaginal symptoms   Wt Readings from Last 3 Encounters:  09/06/21 259 lb 12.8 oz (117.8 kg)  03/29/21 254 lb (115.2 kg)  02/05/21 252 lb (114.3 kg)   44.59 kg/m   Vaginal discharge -weird  She had BV in the spring and took flagyl - it gave her GI side effects  Similar to the past  Slight odor  Enough discharge to use a tampon  About a week   No itching or burning or irritation   Is open to STD screening  Uses condoms usually   No suprapubic pain or cramping   Also spotting /period (this is occasional with continuous OC)   No burning with urination or frequently   Pap utd 06/2020 On ortho tri cyclen for contraception   Having a lot of hot flashes lately / some sweats  During the day and sometimes at night  Wonders if she is perimenopausal   Having trouble loosing weight also   She called on 8/14- concerned about possible vaginitis  [place] headed [place] country on [place]   Results for orders placed or performed in visit on 09/06/21  POCT URINALYSIS DIP ([facility])  Result Value Ref Range   Color, UA yellow yellow   Clarity, UA clear clear   Glucose, UA negative negative mg/dL   Bilirubin, UA negative negative   Ketones, POC UA negative negative mg/dL   Spec Grav, UA 8.657 8.469 - 1.025   Blood, UA small (A) negative   pH, UA 6.0 5.0 - 8.0   POC PROTEIN,UA trace negative, trace   Urobilinogen, UA 0.2 0.2 or 1.0 E.U./dL   Nitrite, UA Negative Negative   Leukocytes, UA Trace (A) Negative  POCT Wet Prep ([facility])  Result Value Ref Range   Source Wet Prep POC vaginal    WBC, Wet Prep HPF POC few    Bacteria Wet Prep HPF POC Few Few   BACTERIA WET PREP MORPHOLOGY POC     Clue Cells Wet Prep HPF POC Moderate (A) None   Clue Cells Wet Prep Whiff POC     Yeast Wet Prep HPF POC None None   KOH Wet Prep POC None None   Trichomonas  Wet Prep HPF POC Absent Absent   Patient Active Problem List   Diagnosis Date Noted   Screen for STD (sexually transmitted disease) 09/06/2021   Hot flashes 09/06/2021   Vaginal discharge 03/29/2021   Stress reaction 02/05/2021   Encounter for screening mammogram for breast cancer 01/24/2021   Lip swelling 07/10/2020   Morbid obesity (HCC) 07/06/2020   Vitamin D deficiency 07/06/2020   Routine general medical examination at a health care facility 07/06/2020   Encounter for gynecological examination (general) (routine) without abnormal findings 07/06/2020   Allergic rhinitis 07/06/2020   CERVICAL STRAIN 03/04/2007   HYPERHIDROSIS 12/15/2006   HYPERLIPIDEMIA 12/14/2006   MIGRAINES, HX OF 12/14/2006   Past Medical History:  Diagnosis Date   Anal fissure    Frequent headaches    Vesicoureteral reflux    Past Surgical History:  Procedure Laterality Date   MMK / ANTERIOR VESICOURETHROPEXY / URETHROPEXY Left 1992   Left kidney @ [facility]   Social History   Tobacco Use   Smoking status: Former   Smokeless tobacco: Never  Vaping Use   Vaping Use: Never used  Substance Use  Topics   Alcohol use: Yes    Comment: Social    Drug use: Never   Family History  Adopted: Yes  Problem Relation Age of Onset   Chronic bronchitis Mother    Hyperthyroidism Mother    Allergies  Allergen Reactions   Buspar [Buspirone]     Dizzy Irritable    Current Outpatient Medications on File Prior to Visit  Medication Sig Dispense Refill   TRI-LO-MILI 0.18/0.215/0.25 MG-25 MCG tab Take 1 tablet by mouth once daily 84 tablet 3   ergocalciferol (VITAMIN D2) 1.25 MG (50000 UT) capsule Take 1 capsule (50,000 Units total) by mouth once a week. (Patient not taking: Reported on 09/06/2021) 12 capsule 0   No current facility-administered medications on file prior to visit.     Review of Systems  Constitutional:  Negative for activity change, appetite change, fatigue, fever and unexpected weight change.   HENT:  Negative for congestion, ear pain, rhinorrhea, sinus pressure and sore throat.   Eyes:  Negative for pain, redness and visual disturbance.  Respiratory:  Negative for cough, shortness of breath and wheezing.   Cardiovascular:  Negative for chest pain and palpitations.  Gastrointestinal:  Negative for abdominal pain, blood in stool, constipation and diarrhea.  Endocrine: Negative for polydipsia and polyuria.  Genitourinary:  Positive for vaginal discharge. Negative for dysuria, frequency and urgency.  Musculoskeletal:  Negative for arthralgias, back pain and myalgias.  Skin:  Negative for pallor and rash.  Allergic/Immunologic: Negative for environmental allergies.  Neurological:  Negative for dizziness, syncope and headaches.  Hematological:  Negative for adenopathy. Does not bruise/bleed easily.  Psychiatric/Behavioral:  Negative for decreased concentration and dysphoric mood. The patient is not nervous/anxious.        Objective:   Physical Exam Constitutional:      General: She is not in acute distress.    Appearance: Normal appearance. She is well-developed. She is obese. She is not ill-appearing or diaphoretic.  HENT:     Head: Normocephalic and atraumatic.     Mouth/Throat:     Mouth: Mucous membranes are moist.  Eyes:     Conjunctiva/sclera: Conjunctivae normal.     Pupils: Pupils are equal, round, and reactive to light.  Neck:     Thyroid: No thyromegaly.     Vascular: No carotid bruit or JVD.  Cardiovascular:     Rate and Rhythm: Normal rate and regular rhythm.     Heart sounds: Normal heart sounds.     No gallop.  Pulmonary:     Effort: Pulmonary effort is normal. No respiratory distress.     Breath sounds: Normal breath sounds. No wheezing or rales.  Abdominal:     General: There is no distension or abdominal bruit.     Palpations: Abdomen is soft.     Tenderness: There is no right CVA tenderness or left CVA tenderness.     Comments: No suprapubic  tenderness or fullness    Genitourinary:    Comments: Scant vaginal discharge No skin or mucosa changes Wet prep obt - noted clue cells  Musculoskeletal:     Cervical back: Normal range of motion and neck supple.     Right lower leg: No edema.     Left lower leg: No edema.  Lymphadenopathy:     Cervical: No cervical adenopathy.  Skin:    General: Skin is warm and dry.     Coloration: Skin is not pale.     Findings: No rash.  Neurological:  Mental Status: She is alert.     Coordination: Coordination normal.     Deep Tendon Reflexes: Reflexes are normal and symmetric. Reflexes normal.  Psychiatric:        Attention and Perception: Attention normal.        Mood and Affect: Mood normal.        Cognition and Memory: Cognition normal.     Comments: Pt seems mildly fatigued and stressed           Assessment & Plan:   Problem List Items Addressed This Visit       Cardiovascular and Mediastinum   Hot flashes    Pt is struggling even on continuous OC  Spotting  Sweats/hot flashes   Referral done to gyn      Relevant Orders   Ambulatory referral to Gynecology     Other   Screen for STD (sexually transmitted disease)    gc and chlamydia tests ordered       Relevant Orders   C. trachomatis/N. gonorrhoeae RNA   Vaginal discharge - Primary    Clue cells on wet prep  Has had BV before and dislikes oral flagyl (had GI upset)  Will tx with metrogel vaginal   Update if not starting to improve in a week or if worsening        Relevant Orders   POCT Wet Prep ([facility]) (Completed)   C. trachomatis/N. gonorrhoeae RNA   Other Visit Diagnoses     Urine frequency       Relevant Orders   POCT URINALYSIS DIP ([facility]) (Completed)   Abnormal urine odor

## 2021-09-06 NOTE — Patient Instructions (Addendum)
Use the metro gel vaginally as directed Eat yogurt daily or take a probiotic daily over the counter   If not improved let me know   I will refer you to gyn for the hot flashes   If you don't get a call in 1-2 weeks let us know

## 2021-09-08 NOTE — Assessment & Plan Note (Signed)
Pt is struggling even on continuous OC  Spotting  Sweats/hot flashes   Referral done to gyn

## 2021-09-08 NOTE — Assessment & Plan Note (Signed)
Clue cells on wet prep  Has had BV before and dislikes oral flagyl (had GI upset)  Will tx with metrogel vaginal   Update if not starting to improve in a week or if worsening

## 2021-09-08 NOTE — Assessment & Plan Note (Signed)
gc and chlamydia tests ordered

## 2021-09-09 NOTE — Addendum Note (Signed)
Addended by: Alvina Chou on: 09/09/2021 11:01 AM   Modules accepted: Orders

## 2021-09-10 DIAGNOSIS — N898 Other specified noninflammatory disorders of vagina: Secondary | ICD-10-CM | POA: Diagnosis not present

## 2021-09-10 DIAGNOSIS — Z113 Encounter for screening for infections with a predominantly sexual mode of transmission: Secondary | ICD-10-CM | POA: Diagnosis not present

## 2021-09-11 LAB — C. TRACHOMATIS/N. GONORRHOEAE RNA
C. trachomatis RNA, TMA: NOT DETECTED
N. gonorrhoeae RNA, TMA: NOT DETECTED

## 2021-09-23 ENCOUNTER — Encounter: Payer: Self-pay | Admitting: Family Medicine

## 2021-09-25 MED ORDER — FLUCONAZOLE 150 MG PO TABS: ORAL_TABLET | ORAL | 0 refills | Status: DC

## 2021-10-11 ENCOUNTER — Ambulatory Visit (INDEPENDENT_AMBULATORY_CARE_PROVIDER_SITE_OTHER): Payer: 59 | Admitting: Family Medicine

## 2021-10-11 ENCOUNTER — Encounter: Payer: Self-pay | Admitting: Family Medicine

## 2021-10-11 VITALS — BP 122/76 | HR 91 | Temp 97.6°F | Ht 64.0 in | Wt 260.5 lb

## 2021-10-11 DIAGNOSIS — R300 Dysuria: Secondary | ICD-10-CM | POA: Insufficient documentation

## 2021-10-11 DIAGNOSIS — R8271 Bacteriuria: Secondary | ICD-10-CM | POA: Diagnosis not present

## 2021-10-11 DIAGNOSIS — N898 Other specified noninflammatory disorders of vagina: Secondary | ICD-10-CM | POA: Diagnosis not present

## 2021-10-11 LAB — POC URINALSYSI DIPSTICK (AUTOMATED)
Bilirubin, UA: NEGATIVE
Blood, UA: 25
Glucose, UA: NEGATIVE
Ketones, UA: NEGATIVE
Nitrite, UA: NEGATIVE
Protein, UA: NEGATIVE
Spec Grav, UA: 1.025 (ref 1.010–1.025)
Urobilinogen, UA: 0.2 E.U./dL
pH, UA: 6 (ref 5.0–8.0)

## 2021-10-11 LAB — POCT WET PREP (WET MOUNT): Trichomonas Wet Prep HPF POC: ABSENT

## 2021-10-11 MED ORDER — METRONIDAZOLE 500 MG PO TABS: 500.0000 mg | ORAL_TABLET | Freq: Two times a day (BID) | ORAL | 0 refills | Status: DC

## 2021-10-11 NOTE — Progress Notes (Unsigned)
   Subjective:    Patient ID: Rachael Mora, female    DOB: May 16, 1980, 41 y.o.   MRN: [id number]  HPI Pt presents for f/u of GU symptoms   Wt Readings from Last 3 Encounters:  10/11/21 260 lb 8 oz (118.2 kg)  09/06/21 259 lb 12.8 oz (117.8 kg)  03/29/21 254 lb (115.2 kg)   44.71 kg/m  Seen on 8/20 for vaginal discharge  Had clue cells on wet prep ant rx for BV  (had it before that in march)   She has called c/o cloudy urine that smells strong  Earlier this month vaginal d/c and discomfort   Was tx for yeast with diflucan  Gyn referral was placed   Last pap 06/2020  nl with neg HPV   Now  Yellow vaginal discharge   Cloudy urine (even with good water intake)   Some stinging discomfort with urination  No urgency  No frequency  No fever or nausea  No bladder pressure   No exp to STDs   No vaginal itching  Diflucan did not help   In the past - the gel for BV did not seem to work as well  Flagyl gave her diarrhea   Ua- blood and leuk  Results for orders placed or performed in visit on 10/11/21  POCT Wet Prep [employer])  Result Value Ref Range   Source Wet Prep POC vaginal    WBC, Wet Prep HPF POC many    Bacteria Wet Prep HPF POC Many (A) Few   BACTERIA WET PREP MORPHOLOGY POC     Clue Cells Wet Prep HPF POC Moderate (A) None   Clue Cells Wet Prep Whiff POC     Yeast Wet Prep HPF POC None None   KOH Wet Prep POC None None   Trichomonas Wet Prep HPF POC Absent Absent  POCT Urinalysis Dipstick (Automated)  Result Value Ref Range   Color, UA Yellow    Clarity, UA Hazy    Glucose, UA Negative Negative   Bilirubin, UA Negative    Ketones, UA Negative    Spec Grav, UA 1.025 1.010 - 1.025   Blood, UA 25 Ery/uL    pH, UA 6.0 5.0 - 8.0   Protein, UA Negative Negative   Urobilinogen, UA 0.2 0.2 or 1.0 E.U./dL   Nitrite, UA Negative    Leukocytes, UA Small (1+) (A) Negative     Taking probiotic   Review of Systems     Objective:   Physical  Exam        Assessment & Plan:

## 2021-10-11 NOTE — Patient Instructions (Addendum)
Take the generic flagyl twice daily for a week   We will contact you with the urine culture result   Drink fluids Continue probiotic of choice   Update if not starting to improve in a week or if worsening

## 2021-10-12 LAB — URINE CULTURE
MICRO NUMBER:: 13956024
SPECIMEN QUALITY:: ADEQUATE

## 2021-10-13 ENCOUNTER — Telehealth: Payer: Self-pay | Admitting: Family Medicine

## 2021-10-13 DIAGNOSIS — R8271 Bacteriuria: Secondary | ICD-10-CM | POA: Insufficient documentation

## 2021-10-13 MED ORDER — AMOXICILLIN 500 MG PO CAPS: 500.0000 mg | ORAL_CAPSULE | Freq: Two times a day (BID) | ORAL | 0 refills | Status: AC

## 2021-10-13 NOTE — Assessment & Plan Note (Signed)
With vaginal discharge/vaginitis  sm leuk and blood on ua  Culture pending

## 2021-10-13 NOTE — Assessment & Plan Note (Signed)
Clue cells on wet prep In past oral flagyl works better than topical treatment   Px flagyl Disc probiotic-may be helpful  Update if not starting to improve in a week or if worsening

## 2021-10-13 NOTE — Assessment & Plan Note (Signed)
Urine culture grew out group B strep Will treat with amoxicillin

## 2021-10-13 NOTE — Telephone Encounter (Signed)
Your urine grew out group B strep= a bacterial that can cause some urinary symptoms   I sent amoxicillin to your pharmacy  Let us know if urinary and vaginal symptoms do not improve

## 2021-10-14 ENCOUNTER — Encounter: Payer: Self-pay | Admitting: Family Medicine

## 2021-10-14 NOTE — Telephone Encounter (Signed)
Pt viewed results and Dr. Tower's comments via mychart  ?

## 2021-11-05 ENCOUNTER — Encounter: Payer: Self-pay | Admitting: Obstetrics & Gynecology

## 2021-11-05 ENCOUNTER — Ambulatory Visit: Payer: 59 | Admitting: Obstetrics & Gynecology

## 2021-11-05 VITALS — BP 139/82 | HR 102 | Wt 263.0 lb

## 2021-11-05 DIAGNOSIS — N760 Acute vaginitis: Secondary | ICD-10-CM

## 2021-11-05 DIAGNOSIS — N951 Menopausal and female climacteric states: Secondary | ICD-10-CM

## 2021-11-05 MED ORDER — VENLAFAXINE HCL ER 75 MG PO CP24: 75.0000 mg | ORAL_CAPSULE | Freq: Every day | ORAL | 3 refills | Status: DC

## 2021-11-05 MED ORDER — METRONIDAZOLE 0.75 % VA GEL: 1.0000 | Freq: Every day | VAGINAL | 7 refills | Status: DC

## 2021-11-05 NOTE — Progress Notes (Signed)
GYNECOLOGY OFFICE VISIT NOTE  History:   Rachael Mora is a 41 y.o. G0 here today for management of recurrent bacterial vaginitis episodes and also bothersome occasional hot flashes for the last year.  Has been on multiple Metronidazole regimens in the last few months, still gets recurrent vaginitis. As for the hot flashes, this happens occasionally and get very bad and interfere with her activities. Patient is adopted, no known family history of early menopause.  She denies any abnormal vaginal discharge, bleeding, pelvic pain or other concerns.    Past Medical History:  Diagnosis Date   Anal fissure    Frequent headaches    Vesicoureteral reflux     Past Surgical History:  Procedure Laterality Date   MMK / ANTERIOR VESICOURETHROPEXY / URETHROPEXY Left 1992   Left kidney @ [facility]    The following portions of the patient's history were reviewed and updated as appropriate: allergies, current medications, past family history, past medical history, past social history, past surgical history and problem list.   Health Maintenance:  Normal pap and negative HRHPV on 07/06/2020.  Normal mammogram on 04/03/2021.   Review of Systems:  Pertinent items noted in HPI and remainder of comprehensive ROS otherwise negative.  Physical Exam:  BP 139/82   Pulse (!) 102   Wt 263 lb (119.3 kg)   BMI 45.14 kg/m  CONSTITUTIONAL: Well-developed, well-nourished female in no acute distress.  HEENT:  Normocephalic, atraumatic. External right and left ear normal. No scleral icterus.  NECK: Normal range of motion, supple, no masses noted on observation SKIN: No rash noted. Not diaphoretic. No erythema. No pallor. MUSCULOSKELETAL: Normal range of motion. No edema noted. NEUROLOGIC: Alert and oriented to person, place, and time. Normal muscle tone coordination. No cranial nerve deficit noted. PSYCHIATRIC: Normal mood and affect. Normal behavior. Normal judgment and thought content. CARDIOVASCULAR: Normal  heart rate noted RESPIRATORY: Effort and breath sounds normal, no problems with respiration noted ABDOMEN: No masses noted. No other overt distention noted.   PELVIC: Deferred     Assessment and Plan:    1. Recurrent vaginitis Proper vulvar hygiene emphasized: washing vulva with hypoallergenic and sensitive soap and mostly water, discussed avoidance of perfumed soaps, detergents, lotions and any type of douches; in addition to wearing cotton underwear and no underwear at night.  Also recommended cleaning front to back, voiding and cleaning up after intercourse.  Patient meets criteria for prolonged therapy. Offered prolonged vaginal metronidazole vs boric acid vaginal therapy. She desires vaginal Metronidazole.  Will monitor response. - metroNIDAZOLE (METROGEL) 0.75 % vaginal gel; Place 1 Applicatorful vaginally at bedtime. Apply one applicatorful to vagina at bedtime for 10 days, then twice a week for 6 months.  Dispense: 70 g; Refill: 7  2. Hot flushes, perimenopausal Discussed that perimenopausal symptoms can occur several years prior to menopause. Given her hot flashes, recommended switching her Tri-Lo-Milli to Milli vs trial of Effexor.  No history of BP issues, but BP today is 139/82 so hesitant to go to 35 mcg estrogen pill (such as Milli) for now.  She wants to try Effexor.  Will monitor effect.  If  symptoms do not improve significantly, consider increasing doasge to 150 mg daily and/or changing contraceptive pills to Milli or 20 mcg monophasic pill containing 20 mcg of estradiol. - venlafaxine XR (EFFEXOR-XR) 75 MG 24 hr capsule; Take 1 capsule (75 mg total) by mouth daily.  Dispense: 30 capsule; Refill: 3  Routine preventative health maintenance measures emphasized. Please refer to  After Visit Summary for other counseling recommendations.   Return in about 2 months (around 01/05/2022) for Followup (can be virtual).    I spent 30 minutes dedicated to the care of this patient including  pre-visit review of records, face to face time with the patient discussing her conditions and treatments and post visit orders.    Jaynie Collins, MD, FACOG Obstetrician & Gynecologist, [facility] for [employer], [facility]

## 2021-11-18 ENCOUNTER — Encounter: Payer: Self-pay | Admitting: Obstetrics & Gynecology

## 2021-12-26 ENCOUNTER — Encounter: Payer: 59 | Admitting: Obstetrics & Gynecology

## 2021-12-31 ENCOUNTER — Telehealth: Payer: 59 | Admitting: Obstetrics & Gynecology

## 2022-01-05 ENCOUNTER — Other Ambulatory Visit: Payer: Self-pay | Admitting: Family Medicine

## 2022-01-06 ENCOUNTER — Encounter: Payer: Self-pay | Admitting: Obstetrics & Gynecology

## 2022-01-06 ENCOUNTER — Encounter: Payer: Self-pay | Admitting: Family Medicine

## 2022-01-06 NOTE — Telephone Encounter (Signed)
Patient called to see when the prescription would be filled.

## 2022-01-24 ENCOUNTER — Telehealth: Payer: Self-pay | Admitting: *Deleted

## 2022-01-24 MED ORDER — NORGESTIM-ETH ESTRAD TRIPHASIC 0.18/0.215/0.25 MG-25 MCG PO TABS: ORAL_TABLET | ORAL | 2 refills | Status: DC

## 2022-01-24 NOTE — Telephone Encounter (Signed)
Pt called asking to send Rx to different pharmacy due to issues with [facility].

## 2022-02-05 ENCOUNTER — Encounter: Payer: Self-pay | Admitting: Family Medicine

## 2022-02-05 ENCOUNTER — Ambulatory Visit: Payer: 59 | Admitting: Family Medicine

## 2022-02-05 VITALS — BP 128/72 | HR 94 | Temp 97.9°F | Resp 16 | Ht 64.0 in | Wt 263.1 lb

## 2022-02-05 DIAGNOSIS — F430 Acute stress reaction: Secondary | ICD-10-CM | POA: Diagnosis not present

## 2022-02-05 DIAGNOSIS — R519 Headache, unspecified: Secondary | ICD-10-CM

## 2022-02-05 DIAGNOSIS — F419 Anxiety disorder, unspecified: Secondary | ICD-10-CM | POA: Insufficient documentation

## 2022-02-05 DIAGNOSIS — R232 Flushing: Secondary | ICD-10-CM | POA: Diagnosis not present

## 2022-02-05 DIAGNOSIS — R69 Illness, unspecified: Secondary | ICD-10-CM | POA: Diagnosis not present

## 2022-02-05 DIAGNOSIS — R5382 Chronic fatigue, unspecified: Secondary | ICD-10-CM | POA: Diagnosis not present

## 2022-02-05 DIAGNOSIS — H539 Unspecified visual disturbance: Secondary | ICD-10-CM | POA: Insufficient documentation

## 2022-02-05 DIAGNOSIS — R5383 Other fatigue: Secondary | ICD-10-CM | POA: Insufficient documentation

## 2022-02-05 DIAGNOSIS — G8929 Other chronic pain: Secondary | ICD-10-CM

## 2022-02-05 NOTE — Assessment & Plan Note (Signed)
A little worse lately due to hormone change  Good habits Avoiding caffeine unless headache Did see specialist in past  Effexor from gyn may help   Will continue to follow  Had aura once- ? About safety of hormonal tx

## 2022-02-05 NOTE — Assessment & Plan Note (Signed)
Primarily anxious symptoms  See a/p for anx mood    Ref to new counselor  Consider starting the velafaxine

## 2022-02-05 NOTE — Assessment & Plan Note (Signed)
Suspect multifactorial  Hormone change  Stressors   Causing nervousness, tearfulness and irritability Reviewed stressors/ coping techniques/symptoms/ support sources/ tx options and side effects in detail today May benefit from a new counselor-referral done  May benefit from effexor xr 75 mg from gyn- she is considering starting it  Also menopause treatment if applicable   Good self care Avoids caffeine except for headache  Good water intake Good exercise Enc to keep it up

## 2022-02-05 NOTE — Patient Instructions (Addendum)
Talk to your gyn about possible peri menopause vs menopause   ? If you need a higher dose of the oral contraceptive or if you need to hold it and do some hormone testing  This depends on blood pressure (which is good today)  See the eye doctor as planned    Drink water Exercise  Avoid caffeine unless you have a headache   Give some thought to trying the generic effexor  It may help anxiety  , hot flashes and even headaches   I will place a referral for counseling  If you don't get a call in 1-2 weeks let us know   Let's check some labs also

## 2022-02-05 NOTE — Assessment & Plan Note (Signed)
Was considering inc OC or change to monophasic  Rev her gyn note But bp was up that day (better now) ? If needs testing for menopause   Will f/u with gyn to discuss  In meantime effexor may help mood and hot flashes

## 2022-02-05 NOTE — Progress Notes (Signed)
Subjective:    Patient ID: Rachael Mora, female    DOB: [birth year], 42 y.o.   MRN: [id number]  HPI Pt presents for possible high blood pressure with vision issues   Wt Readings from Last 3 Encounters:  02/05/22 263 lb 2 oz (119.4 kg)  11/05/21 263 lb (119.3 kg)  10/11/21 260 lb 8 oz (118.2 kg)   45.17 kg/m   Vision problems for several months  She bought some readers-no help  Intermittent blurry vision   Has an appt with eye doctor coming up    Wakes up in the middle of the night with panicky feeling and a red/flused face  No headache at that time   For the past 2 months has had more headaches than usual  Migraine on Sunday with light sensitivity  Had an aura and that is unusual for her  Sent to headache clinic for a while     Had one bp high at her gyn office  Takes OC     BP Readings from Last 3 Encounters:  02/05/22 128/72  11/05/21 139/82  10/11/21 122/76   Pulse Readings from Last 3 Encounters:  02/05/22 94  11/05/21 (!) 102  10/11/21 91     She was put on velafaxine from her gyn but decided not to take it  Was px for hot flashes   Was having a lot of hot flashes  She takes her OC without withdrawal days /no spotting   Using the metro gel vaginal  BV - ondoing   Has f/u with gyn later this month   A lot of stress Her mother has dementia  Stressful job  A lot of deadlines  Irritable = people get on her nerves  Found her bio mother and she is not nice   Not in counseling now  She paused it because it make started making her self esteem lower  Made her overthink and be in her head  Was doing better help   Birth mom did have thyroid problem and chron's   Lab Results  Component Value Date   TSH 1.92 07/06/2020   Some fatigue   Lab Results  Component Value Date   HGBA1C 5.3 07/08/2019    Patient Active Problem List   Diagnosis Date Noted   Anxious mood 02/05/2022   Fatigue 02/05/2022   Vision changes 02/05/2022    Chronic headaches 02/05/2022   Group B streptococcal bacteriuria 10/13/2021   Dysuria 10/11/2021   Screen for STD (sexually transmitted disease) 09/06/2021   Hot flashes 09/06/2021   Vaginal discharge 03/29/2021   Stress reaction 02/05/2021   Encounter for screening mammogram for breast cancer 01/24/2021   Lip swelling 07/10/2020   Morbid obesity ([place]) 07/06/2020   Vitamin D deficiency 07/06/2020   Routine general medical examination at a health care facility 07/06/2020   Encounter for gynecological examination (general) (routine) without abnormal findings 07/06/2020   Allergic rhinitis 07/06/2020   CERVICAL STRAIN 03/04/2007   HYPERHIDROSIS 12/15/2006   HYPERLIPIDEMIA 12/14/2006   MIGRAINES, HX OF 12/14/2006   Past Medical History:  Diagnosis Date   Anal fissure    Frequent headaches    Vesicoureteral reflux    Past Surgical History:  Procedure Laterality Date   MMK / ANTERIOR VESICOURETHROPEXY / URETHROPEXY Left 1992   Left kidney @ [facility]   Social History   Tobacco Use   Smoking status: Former   Smokeless tobacco: Never  Vaping Use   Vaping Use: Never used  Substance  Use Topics   Alcohol use: Yes    Comment: Social    Drug use: Never   Family History  Adopted: Yes  Problem Relation Age of Onset   Chronic bronchitis Mother    Hyperthyroidism Mother    Allergies  Allergen Reactions   Buspar [Buspirone]     Dizzy Irritable    Current Outpatient Medications on File Prior to Visit  Medication Sig Dispense Refill   metroNIDAZOLE (METROGEL) 0.75 % vaginal gel Place 1 Applicatorful vaginally at bedtime. Apply one applicatorful to vagina at bedtime for 10 days, then twice a week for 6 months. 70 g 7   Norgestimate-Ethinyl Estradiol Triphasic (TRI-LO-MILI) 0.18/0.215/0.25 MG-25 MCG tab Take one tablet daily. Take continuously do not take placebo week. 84 tablet 2   venlafaxine XR (EFFEXOR-XR) 75 MG 24 hr capsule Take 1 capsule (75 mg total) by mouth daily. 30 capsule  3   ergocalciferol (VITAMIN D2) 1.25 MG (50000 UT) capsule Take 1 capsule (50,000 Units total) by mouth once a week. 12 capsule 0   No current facility-administered medications on file prior to visit.    Review of Systems  Constitutional:  Positive for fatigue. Negative for activity change, appetite change, fever and unexpected weight change.  HENT:  Negative for congestion, ear pain, rhinorrhea, sinus pressure and sore throat.   Eyes:  Positive for visual disturbance. Negative for pain, discharge, redness and itching.  Respiratory:  Negative for cough, shortness of breath and wheezing.   Cardiovascular:  Negative for chest pain and palpitations.  Gastrointestinal:  Negative for abdominal pain, blood in stool, constipation and diarrhea.  Endocrine: Positive for heat intolerance. Negative for polydipsia and polyuria.  Genitourinary:  Negative for dysuria, frequency and urgency.  Musculoskeletal:  Negative for arthralgias, back pain and myalgias.  Skin:  Negative for pallor and rash.  Allergic/Immunologic: Negative for environmental allergies.  Neurological:  Positive for headaches. Negative for dizziness and syncope.  Hematological:  Negative for adenopathy. Does not bruise/bleed easily.  Psychiatric/Behavioral:  Positive for dysphoric mood and sleep disturbance. Negative for behavioral problems and decreased concentration. The patient is nervous/anxious.        Objective:   Physical Exam Constitutional:      General: She is not in acute distress.    Appearance: She is well-developed. She is obese. She is not ill-appearing or diaphoretic.  HENT:     Head: Normocephalic and atraumatic.  Eyes:     Conjunctiva/sclera: Conjunctivae normal.     Pupils: Pupils are equal, round, and reactive to light.  Neck:     Thyroid: No thyromegaly.     Vascular: No carotid bruit or JVD.  Cardiovascular:     Rate and Rhythm: Normal rate and regular rhythm.     Heart sounds: Normal heart sounds.      No gallop.  Pulmonary:     Effort: Pulmonary effort is normal. No respiratory distress.     Breath sounds: Normal breath sounds. No wheezing or rales.  Abdominal:     General: There is no distension or abdominal bruit.     Palpations: Abdomen is soft. There is no mass.     Tenderness: There is no abdominal tenderness. There is no right CVA tenderness, left CVA tenderness, guarding or rebound.  Musculoskeletal:     Cervical back: Normal range of motion and neck supple.     Right lower leg: No edema.     Left lower leg: No edema.  Lymphadenopathy:     Cervical: No  cervical adenopathy.  Skin:    General: Skin is warm and dry.     Coloration: Skin is not jaundiced or pale.     Findings: No bruising or rash.  Neurological:     Mental Status: She is alert.     Coordination: Coordination normal.     Deep Tendon Reflexes: Reflexes are normal and symmetric. Reflexes normal.  Psychiatric:        Attention and Perception: Attention normal.        Mood and Affect: Mood is anxious. Affect is tearful.        Speech: Speech normal.        Behavior: Behavior normal.        Cognition and Memory: Cognition and memory normal.     Comments: Candidly discusses symptoms and stressors             Assessment & Plan:   Problem List Items Addressed This Visit       Cardiovascular and Mediastinum   Hot flashes    Was considering inc OC or change to monophasic  Rev her gyn note But bp was up that day (better now) ? If needs testing for menopause   Will f/u with gyn to discuss  In meantime effexor may help mood and hot flashes      Relevant Orders   Comprehensive metabolic panel   TSH   Hemoglobin A1c     Other   Anxious mood - Primary    Suspect multifactorial  Hormone change  Stressors   Causing nervousness, tearfulness and irritability Reviewed stressors/ coping techniques/symptoms/ support sources/ tx options and side effects in detail today May benefit from a new  counselor-referral done  May benefit from effexor xr 75 mg from gyn- she is considering starting it  Also menopause treatment if applicable   Good self care Avoids caffeine except for headache  Good water intake Good exercise Enc to keep it up      Relevant Orders   Comprehensive metabolic panel   TSH   Chronic headaches    A little worse lately due to hormone change  Good habits Avoiding caffeine unless headache Did see specialist in past  Effexor from gyn may help   Will continue to follow  Had aura once- ? About safety of hormonal tx      Fatigue    Suspect multifactorial with stress/ anx/ hormone change and hot flashes   Lab today      Relevant Orders   Comprehensive metabolic panel   TSH   Hemoglobin A1c   Stress reaction    Primarily anxious symptoms  See a/p for anx mood    Ref to new counselor  Consider starting the velafaxine      Vision changes    Nl bp today  Has never seen eye doctor in life ? If poss age rel change or needs glasses A lot of eye strain also  Some headaches   Has upcoming oph exam

## 2022-02-05 NOTE — Assessment & Plan Note (Signed)
Suspect multifactorial with stress/ anx/ hormone change and hot flashes   Lab today

## 2022-02-05 NOTE — Assessment & Plan Note (Addendum)
Nl bp today  Has never seen eye doctor in life ? If poss age rel change or needs glasses A lot of eye strain also  Some headaches   Has upcoming oph exam

## 2022-02-06 LAB — COMPREHENSIVE METABOLIC PANEL
ALT: 13 U/L (ref 0–35)
AST: 11 U/L (ref 0–37)
Albumin: 3.7 g/dL (ref 3.5–5.2)
Alkaline Phosphatase: 57 U/L (ref 39–117)
BUN: 12 mg/dL (ref 6–23)
CO2: 27 mEq/L (ref 19–32)
Calcium: 9 mg/dL (ref 8.4–10.5)
Chloride: 101 mEq/L (ref 96–112)
Creatinine, Ser: 0.8 mg/dL (ref 0.40–1.20)
GFR: 91.62 mL/min (ref >60.00)
Glucose, Bld: 105 mg/dL — ABNORMAL HIGH (ref 70–99)
Potassium: 4 mEq/L (ref 3.5–5.1)
Sodium: 139 mEq/L (ref 135–145)
Total Bilirubin: 0.2 mg/dL (ref 0.2–1.2)
Total Protein: 6.9 g/dL (ref 6.0–8.3)

## 2022-02-06 LAB — HEMOGLOBIN A1C: Hgb A1c MFr Bld: 6 % (ref 4.6–6.5)

## 2022-02-06 LAB — TSH: TSH: 2.15 u[IU]/mL (ref 0.35–5.50)

## 2022-02-07 ENCOUNTER — Telehealth: Payer: Self-pay | Admitting: Family Medicine

## 2022-02-07 NOTE — Telephone Encounter (Signed)
Pt viewed her results via mychart after our call

## 2022-02-07 NOTE — Telephone Encounter (Signed)
Patient called and stated she was returning K.Fiscal call.

## 2022-02-11 ENCOUNTER — Telehealth: Payer: 59 | Admitting: Obstetrics & Gynecology

## 2022-02-19 ENCOUNTER — Other Ambulatory Visit: Payer: Self-pay | Admitting: Family Medicine

## 2022-02-19 DIAGNOSIS — Z1231 Encounter for screening mammogram for malignant neoplasm of breast: Secondary | ICD-10-CM

## 2022-04-09 ENCOUNTER — Ambulatory Visit
Admission: RE | Admit: 2022-04-09 | Discharge: 2022-04-09 | Disposition: A | Discharge status: Home / Self Care | Payer: 59 | Source: Ambulatory Visit | Attending: Family Medicine | Admitting: Family Medicine

## 2022-04-09 DIAGNOSIS — Z1231 Encounter for screening mammogram for malignant neoplasm of breast: Secondary | ICD-10-CM | POA: Diagnosis not present

## 2022-04-14 ENCOUNTER — Other Ambulatory Visit: Payer: Self-pay | Admitting: Family Medicine

## 2022-04-14 DIAGNOSIS — N760 Acute vaginitis: Secondary | ICD-10-CM

## 2022-06-02 ENCOUNTER — Encounter: Payer: Self-pay | Admitting: Family Medicine

## 2022-07-10 IMAGING — MG MM DIGITAL SCREENING BILAT W/ TOMO AND CAD
6 of 10 series · 6 of 30 positions shown · non-contrast
Comparison: None.

CLINICAL DATA: Screening.

EXAM:
DIGITAL SCREENING BILATERAL MAMMOGRAM WITH TOMOSYNTHESIS AND CAD
TECHNIQUE: Bilateral screening digital craniocaudal and mediolateral oblique
mammograms were obtained. Bilateral screening digital breast
tomosynthesis was performed. The images were evaluated with
computer-aided detection.

[L MLO synth-2D (1 of 2)]
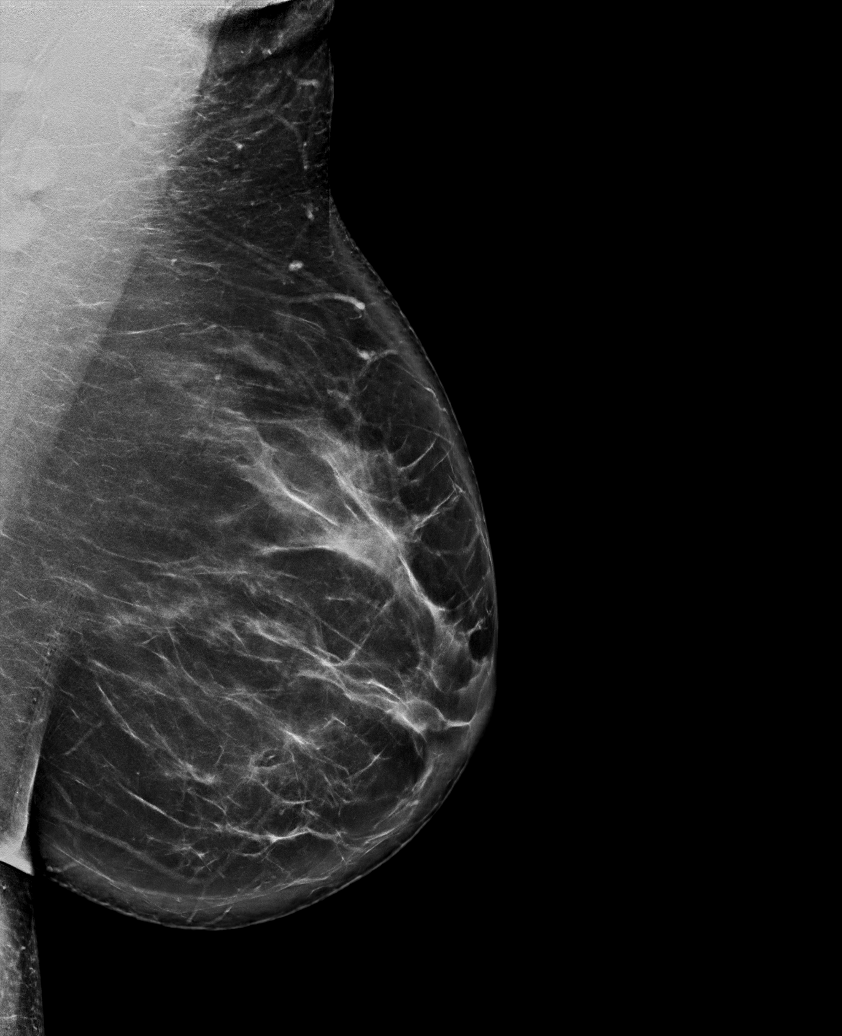

[R MLO synth-2D]
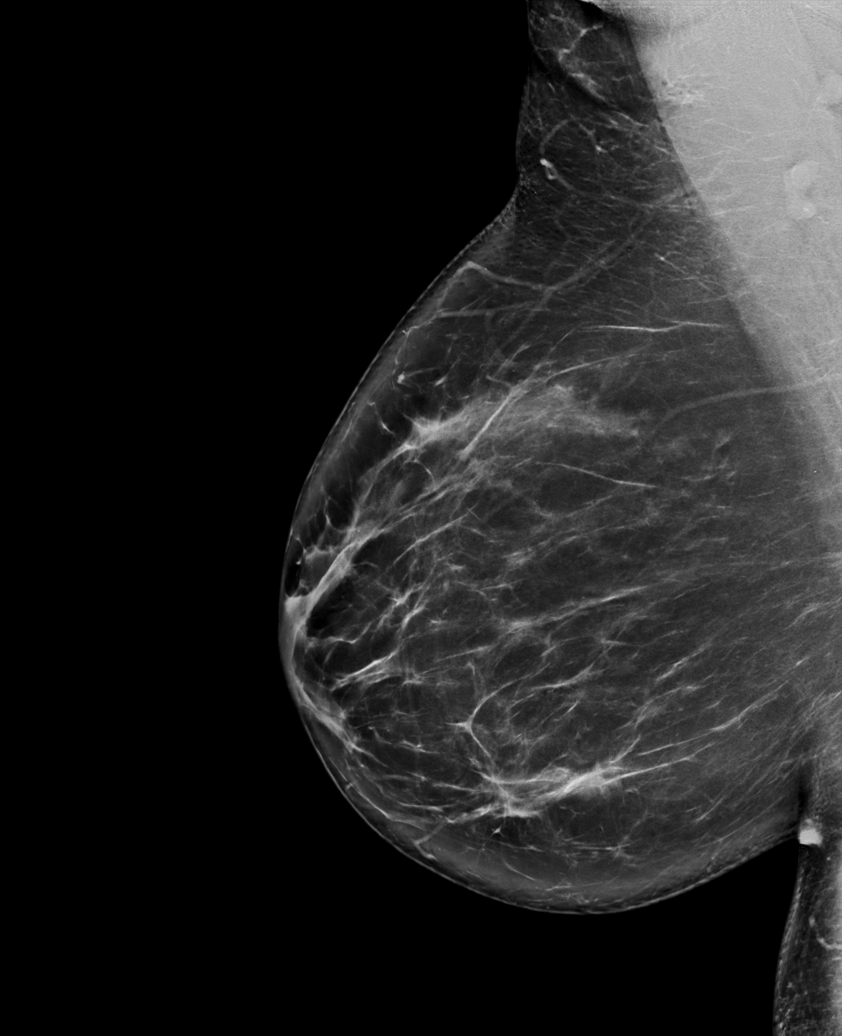

[L CC synth-2D]
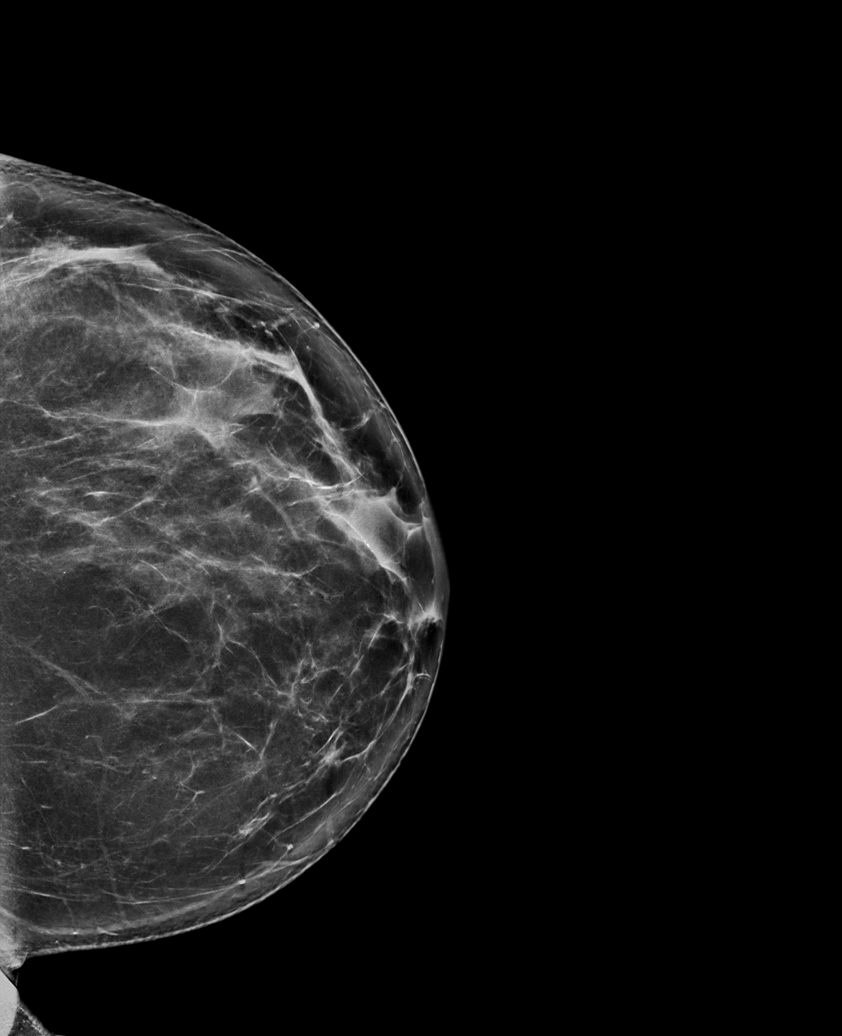

[L MLO synth-2D (2 of 2)]
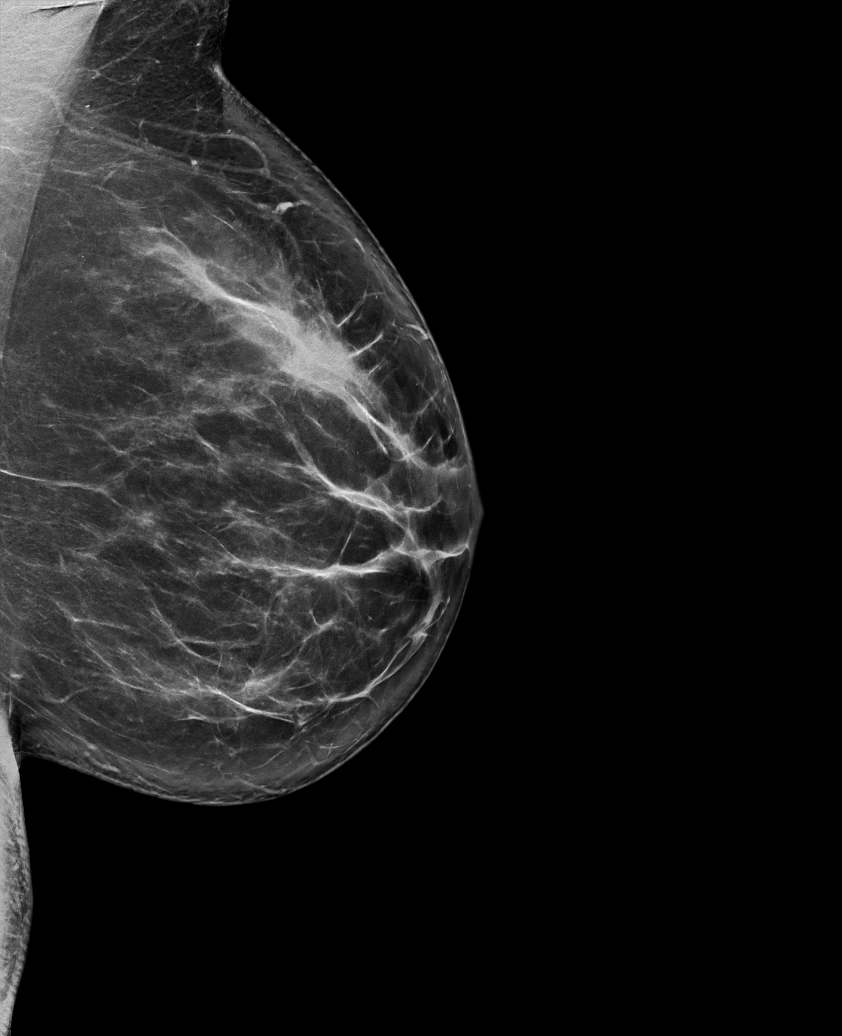

[R CC synth-2D]
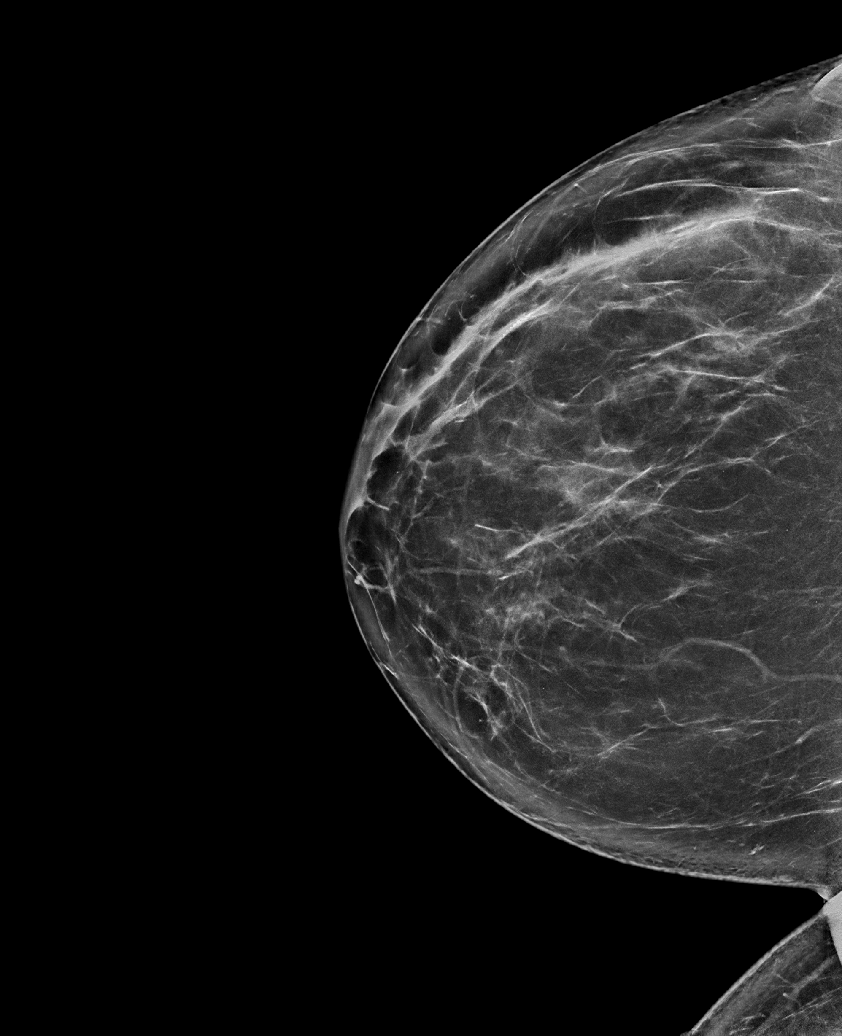

[L MLO tomo · tomo slice 47/94.0]
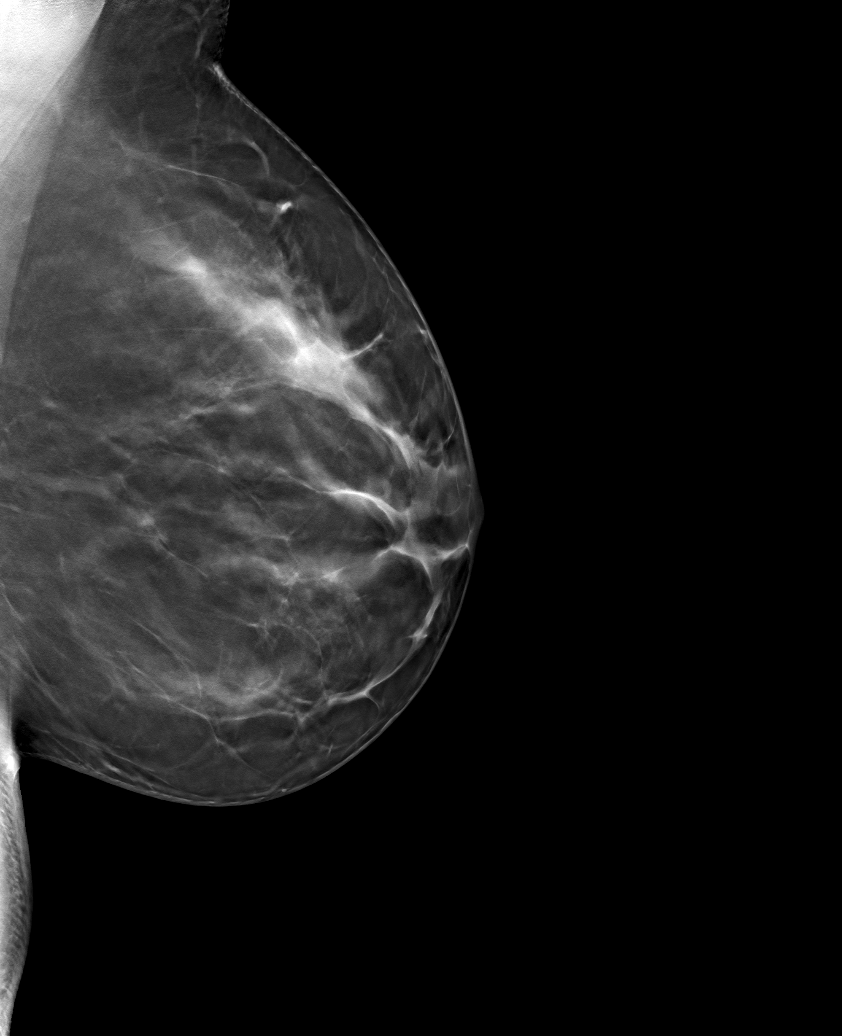

[6 of 30 positions shown; findings below may reference images not displayed]

ACR Breast Density Category b: There are scattered areas of
fibroglandular density.
FINDINGS: There are no findings suspicious for malignancy.
IMPRESSION: No mammographic evidence of malignancy. A result letter of this
screening mammogram will be mailed directly to the patient.

RECOMMENDATION:
Screening mammogram in one year. (Code:[id number])

BI-RADS CATEGORY  1: Negative.

## 2022-07-29 ENCOUNTER — Other Ambulatory Visit: Payer: Self-pay | Admitting: Family Medicine

## 2022-07-29 NOTE — Telephone Encounter (Signed)
Lvm for patient tcb and schedule 

## 2022-07-29 NOTE — Telephone Encounter (Signed)
Pt hasn't had a CPE since 07/06/20, please schedule and then route back to me to refill

## 2022-07-30 NOTE — Telephone Encounter (Signed)
LVM for patient to c/b and schedule.  

## 2022-08-01 NOTE — Telephone Encounter (Signed)
Tried contacting patient multiple times no response. Left VM and sent mychart message for patient to call and schedule.

## 2022-08-04 NOTE — Telephone Encounter (Signed)
Spoke to pt, pt states she has stopped taking Norgestimate-Ethinyl Estradiol Triphasic (TRI-LO-MILI) 0.18/0.215/0.25 MG-25 MCG tab due to a upcoming hormone panel. Pt stated she doesn't need a refill at the moment & she'd call back to schedule cpe. Call back # [phone]

## 2023-03-19 ENCOUNTER — Other Ambulatory Visit: Payer: Self-pay | Admitting: Family Medicine

## 2023-03-19 DIAGNOSIS — Z0000 Encounter for general adult medical examination without abnormal findings: Secondary | ICD-10-CM

## 2023-07-01 ENCOUNTER — Other Ambulatory Visit: Payer: Self-pay | Admitting: Family Medicine

## 2023-07-01 DIAGNOSIS — Z1231 Encounter for screening mammogram for malignant neoplasm of breast: Secondary | ICD-10-CM

## 2023-07-10 ENCOUNTER — Ambulatory Visit
Admission: RE | Admit: 2023-07-10 | Discharge: 2023-07-10 | Disposition: A | Discharge status: Home / Self Care | Source: Ambulatory Visit

## 2023-07-10 DIAGNOSIS — Z1231 Encounter for screening mammogram for malignant neoplasm of breast: Secondary | ICD-10-CM | POA: Diagnosis not present

## 2023-07-14 ENCOUNTER — Other Ambulatory Visit (HOSPITAL_COMMUNITY)
Admission: RE | Admit: 2023-07-14 | Discharge: 2023-07-14 | Disposition: A | Discharge status: Home / Self Care | Source: Ambulatory Visit | Attending: Family Medicine | Admitting: Family Medicine

## 2023-07-14 ENCOUNTER — Ambulatory Visit: Admitting: Family Medicine

## 2023-07-14 ENCOUNTER — Ambulatory Visit: Payer: Self-pay | Admitting: Family Medicine

## 2023-07-14 VITALS — BP 132/80 | HR 85 | Temp 98.1°F | Ht 63.5 in | Wt 270.2 lb

## 2023-07-14 DIAGNOSIS — Z01419 Encounter for gynecological examination (general) (routine) without abnormal findings: Secondary | ICD-10-CM | POA: Diagnosis not present

## 2023-07-14 DIAGNOSIS — E559 Vitamin D deficiency, unspecified: Secondary | ICD-10-CM | POA: Diagnosis not present

## 2023-07-14 DIAGNOSIS — Z1159 Encounter for screening for other viral diseases: Secondary | ICD-10-CM | POA: Insufficient documentation

## 2023-07-14 DIAGNOSIS — Z0000 Encounter for general adult medical examination without abnormal findings: Secondary | ICD-10-CM

## 2023-07-14 DIAGNOSIS — E7800 Pure hypercholesterolemia, unspecified: Secondary | ICD-10-CM

## 2023-07-14 DIAGNOSIS — Z6841 Body Mass Index (BMI) 40.0 and over, adult: Secondary | ICD-10-CM

## 2023-07-14 DIAGNOSIS — F419 Anxiety disorder, unspecified: Secondary | ICD-10-CM

## 2023-07-14 DIAGNOSIS — Z131 Encounter for screening for diabetes mellitus: Secondary | ICD-10-CM | POA: Diagnosis not present

## 2023-07-14 DIAGNOSIS — Z114 Encounter for screening for human immunodeficiency virus [HIV]: Secondary | ICD-10-CM | POA: Insufficient documentation

## 2023-07-14 NOTE — Assessment & Plan Note (Signed)
 Discussed how this problem influences overall health and the risks it imposes  Reviewed plan for weight loss with lower calorie diet (via better food choices (lower glycemic and portion control) along with exercise building up to or more than 30 minutes 5 days per week including some aerobic activity and strength training    Plans to start a boot camp exercise class this week  Finally has some time to do this

## 2023-07-14 NOTE — Assessment & Plan Note (Signed)
 Reviewed health habits including diet and exercise and skin cancer prevention Reviewed appropriate screening tests for age  Also reviewed health mt list, fam hx and immunization status , as well as social and family history   See HPI Labs reviewed and ordered Health Maintenance  Topic Date Due   HIV Screening  Never done   Hepatitis C Screening  Never done   Hepatitis B Vaccine (1 of 3 - 19+ 3-dose series) Never done   HPV Vaccine (1 - 3-dose SCDM series) Never done   COVID-19 Vaccine (1 - 2024-25 season) Never done   Flu Shot  08/21/2023   Mammogram  07/09/2024   Pap with HPV screening  07/06/2025   DTaP/Tdap/Td vaccine (3 - Td or Tdap) 07/07/2030   Meningitis B Vaccine  Aged Out    Gyn exam and pap today  Hep c and HIV screen ordered  Discussed fall prevention, supplements and exercise for bone density  PHQ 2, GAD7 1 - mood is better Lab today

## 2023-07-14 NOTE — Assessment & Plan Note (Signed)
A1c added to labs disc imp of low glycemic diet and wt loss to prevent DM2

## 2023-07-14 NOTE — Assessment & Plan Note (Signed)
 Not currently taking vit D Level ordered  Will advise from there   Discussed importance to bone and overall health

## 2023-07-14 NOTE — Assessment & Plan Note (Signed)
Disc goals for lipids and reasons to control them Rev last labs with pt Rev low sat fat diet in detail Lipid panel today

## 2023-07-14 NOTE — Assessment & Plan Note (Signed)
 Hiv screen today Low risk

## 2023-07-14 NOTE — Progress Notes (Signed)
 Subjective:    Patient ID: Rachael Mora, female    DOB: Jul 14, 1980, 43 y.o.   MRN: [id number]  HPI  Here for health maintenance exam and to review chronic medical problems   Wt Readings from Last 3 Encounters:  07/14/23 270 lb 4 oz (122.6 kg)  02/05/22 263 lb 2 oz (119.4 kg)  11/05/21 263 lb (119.3 kg)   47.12 kg/m  Vitals:   07/14/23 1555  BP: 132/80  Pulse: 85  Temp: 98.1 F (36.7 C)  SpO2: 96%    Immunization History  Administered Date(s) Administered   Td 12/09/2005   Tdap 07/06/2020    Health Maintenance Due  Topic Date Due   HIV Screening  Never done   Hepatitis C Screening  Never done   Hepatitis B Vaccines (1 of 3 - 19+ 3-dose series) Never done   HPV VACCINES (1 - 3-dose SCDM series) Never done   COVID-19 Vaccine (1 - 2024-25 season) Never done   Doing ok  Changed jobs - less stress now !  Feeling good  Working  Taking care of herself   Some traveling this summer     Interested in hep C and HIV screen   Mammogram- 06/2023  Self breast exam- no lumps   Gyn health Pap 06/2020 - negative with neg HPV / transformation zone was absent  Periods are fine / 6 days /not a lot of pain/ moderate flow  Very regular  No contraception  Does not need it      Colon cancer screening -will discuss at 45    Bone health   Falls- none  Fractures-none  Supplements -was taking vitamin D  -no longer on   Last vitamin D  Lab Results  Component Value Date   VD25OH 19 (L) 07/06/2020    Exercise  Has been going to the gym Starting a boot came soon      Mood    07/14/2023    4:02 PM 02/05/2022    2:48 PM 09/06/2021    8:05 AM 07/08/2019    3:33 PM  Depression screen PHQ 2/9  Decreased Interest 0 1 0 0  Down, Depressed, Hopeless 0 1 0 0  PHQ - 2 Score 0 2 0 0  Altered sleeping 1 2    Tired, decreased energy 0 2    Change in appetite 1 0    Feeling bad or failure about yourself  0 1    Trouble concentrating 0 0    Moving slowly or  fidgety/restless 0 0    Suicidal thoughts 0 0    PHQ-9 Score 2 7    Difficult doing work/chores Not difficult at all Somewhat difficult         07/14/2023    4:02 PM 02/05/2022    2:48 PM 02/05/2021    4:11 PM  GAD 7 : Generalized Anxiety Score  Nervous, Anxious, on Edge 1 1 2   Control/stop worrying 0 0 1  Worry too much - different things 0 1 1  Trouble relaxing 0 2 1  Restless 0 2 0  Easily annoyed or irritable 0 3 2  Afraid - awful might happen 0 1 0  Total GAD 7 Score 1 10 7   Anxiety Difficulty Not difficult at all Somewhat difficult Somewhat difficult       Patient Active Problem List   Diagnosis Date Noted   Encounter for screening for HIV 07/14/2023   Encounter for hepatitis C screening test for low risk patient  07/14/2023   Diabetes mellitus screening 07/14/2023   Anxious mood 02/05/2022   Fatigue 02/05/2022   Chronic headaches 02/05/2022   Dysuria 10/11/2021   Hot flashes 09/06/2021   Encounter for screening mammogram for breast cancer 01/24/2021   Morbid obesity (HCC) 07/06/2020   Vitamin D  deficiency 07/06/2020   Routine general medical examination at a health care facility 07/06/2020   Encounter for gynecological examination (general) (routine) without abnormal findings 07/06/2020   Allergic rhinitis 07/06/2020   Hyperlipidemia 12/14/2006   MIGRAINES, HX OF 12/14/2006   Past Medical History:  Diagnosis Date   Anal fissure    Frequent headaches    Vesicoureteral reflux    Past Surgical History:  Procedure Laterality Date   MMK / ANTERIOR VESICOURETHROPEXY / URETHROPEXY Left 1992   Left kidney @ [facility]   Social History   Tobacco Use   Smoking status: Former   Smokeless tobacco: Never  Vaping Use   Vaping status: Never Used  Substance Use Topics   Alcohol use: Yes    Comment: Social    Drug use: Never   Family History  Adopted: Yes  Problem Relation Age of Onset   Chronic bronchitis Mother    Hyperthyroidism Mother    Allergies  Allergen  Reactions   Buspar  [Buspirone ]     Dizzy Irritable    No current outpatient medications on file prior to visit.   No current facility-administered medications on file prior to visit.    Review of Systems  Constitutional:  Negative for activity change, appetite change, fatigue, fever and unexpected weight change.  HENT:  Negative for congestion, ear pain, rhinorrhea, sinus pressure and sore throat.   Eyes:  Negative for pain, redness and visual disturbance.  Respiratory:  Negative for cough, shortness of breath and wheezing.   Cardiovascular:  Negative for chest pain and palpitations.  Gastrointestinal:  Negative for abdominal pain, blood in stool, constipation and diarrhea.  Endocrine: Negative for polydipsia and polyuria.  Genitourinary:  Negative for dysuria, frequency and urgency.  Musculoskeletal:  Negative for arthralgias, back pain and myalgias.  Skin:  Negative for pallor and rash.  Allergic/Immunologic: Negative for environmental allergies.  Neurological:  Negative for dizziness, syncope and headaches.  Hematological:  Negative for adenopathy. Does not bruise/bleed easily.  Psychiatric/Behavioral:  Negative for decreased concentration and dysphoric mood. The patient is not nervous/anxious.        Objective:   Physical Exam Constitutional:      General: She is not in acute distress.    Appearance: Normal appearance. She is well-developed. She is obese. She is not ill-appearing or diaphoretic.  HENT:     Head: Normocephalic and atraumatic.     Right Ear: Tympanic membrane, ear canal and external ear normal.     Left Ear: Tympanic membrane, ear canal and external ear normal.     Nose: Nose normal. No congestion.     Mouth/Throat:     Mouth: Mucous membranes are moist.     Pharynx: Oropharynx is clear. No posterior oropharyngeal erythema.   Eyes:     General: No scleral icterus.    Extraocular Movements: Extraocular movements intact.     Conjunctiva/sclera:  Conjunctivae normal.     Pupils: Pupils are equal, round, and reactive to light.   Neck:     Thyroid : No thyromegaly.     Vascular: No carotid bruit or JVD.   Cardiovascular:     Rate and Rhythm: Normal rate and regular rhythm.     Pulses:  Normal pulses.     Heart sounds: Normal heart sounds.     No gallop.  Pulmonary:     Effort: Pulmonary effort is normal. No respiratory distress.     Breath sounds: Normal breath sounds. No wheezing.     Comments: Good air exch Chest:     Chest wall: No tenderness.  Abdominal:     General: Bowel sounds are normal. There is no distension or abdominal bruit.     Palpations: Abdomen is soft. There is no mass.     Tenderness: There is no abdominal tenderness.     Hernia: No hernia is present.  Genitourinary:    Comments: Breast exam: No mass, nodules, thickening, tenderness, bulging, retraction, inflamation, nipple discharge or skin changes noted.  No axillary or clavicular LA.                   Anus appears normal w/o hemorrhoids or masses       External genitalia : nl appearance and hair distribution/no lesions       Urethral meatus : nl size, no lesions or prolapse       Urethra: no masses, tenderness or scarring      Bladder : no masses or tenderness       Vagina: nl general appearance, no lesions, no significant cystocele  or rectocele    Scant pale discharge     Cervix: difficult to visualize due to habitus/no gross abnormalities       Uterus: nl size, contour, position, and mobility (not fixed) , non tender      Adnexa : no masses, tenderness, enlargement or nodularity           Musculoskeletal:        General: No tenderness. Normal range of motion.     Cervical back: Normal range of motion and neck supple. No rigidity. No muscular tenderness.     Right lower leg: No edema.     Left lower leg: No edema.     Comments: No kyphosis   Lymphadenopathy:     Cervical: No cervical adenopathy.   Skin:    General: Skin is warm and dry.      Coloration: Skin is not pale.     Findings: No erythema or rash.   Neurological:     Mental Status: She is alert. Mental status is at baseline.     Cranial Nerves: No cranial nerve deficit.     Motor: No abnormal muscle tone.     Coordination: Coordination normal.     Gait: Gait normal.     Deep Tendon Reflexes: Reflexes are normal and symmetric. Reflexes normal.   Psychiatric:        Mood and Affect: Mood normal.        Cognition and Memory: Cognition and memory normal.           Assessment & Plan:   Problem List Items Addressed This Visit       Other   Vitamin D  deficiency   Not currently taking vit D Level ordered  Will advise from there   Discussed importance to bone and overall health      Relevant Orders   VITAMIN D  25 Hydroxy (Vit-D Deficiency, Fractures)   Routine general medical examination at a health care facility - Primary   Reviewed health habits including diet and exercise and skin cancer prevention Reviewed appropriate screening tests for age  Also reviewed health mt list, fam hx and immunization status ,  as well as social and family history   See HPI Labs reviewed and ordered Health Maintenance  Topic Date Due   HIV Screening  Never done   Hepatitis C Screening  Never done   Hepatitis B Vaccine (1 of 3 - 19+ 3-dose series) Never done   HPV Vaccine (1 - 3-dose SCDM series) Never done   COVID-19 Vaccine (1 - 2024-25 season) Never done   Flu Shot  08/21/2023   Mammogram  07/09/2024   Pap with HPV screening  07/06/2025   DTaP/Tdap/Td vaccine (3 - Td or Tdap) 07/07/2030   Meningitis B Vaccine  Aged Out    Gyn exam and pap today  Hep c and HIV screen ordered  Discussed fall prevention, supplements and exercise for bone density  PHQ 2, GAD7 1 - mood is better Lab today      Relevant Orders   TSH   Lipid Panel   Comprehensive metabolic panel with GFR   CBC with Differential/Platelet   Morbid obesity (HCC)   Discussed how this problem  influences overall health and the risks it imposes  Reviewed plan for weight loss with lower calorie diet (via better food choices (lower glycemic and portion control) along with exercise building up to or more than 30 minutes 5 days per week including some aerobic activity and strength training    Plans to start a boot camp exercise class this week  Finally has some time to do this       Hyperlipidemia   Disc goals for lipids and reasons to control them Rev last labs with pt Rev low sat fat diet in detail  Lipid panel today      Relevant Orders   Lipid Panel   Comprehensive metabolic panel with GFR   Encounter for screening for HIV   Hiv screen today Low risk      Relevant Orders   HIV Antibody (routine testing w rflx)   Encounter for hepatitis C screening test for low risk patient   Hep C screen today  Low risk      Relevant Orders   Hepatitis C antibody   Encounter for gynecological examination (general) (routine) without abnormal findings   Routine exam and pap  No c/o  Off OC /not needed Regular mensea       Relevant Orders   Cytology - PAP([facility])   Diabetes mellitus screening   A1c added to labs  disc imp of low glycemic diet and wt loss to prevent DM2       Relevant Orders   Hemoglobin A1c   Anxious mood   Improved with job change  GAD7 score of 1

## 2023-07-14 NOTE — Assessment & Plan Note (Signed)
Hep C screen today Low risk 

## 2023-07-14 NOTE — Assessment & Plan Note (Signed)
 Routine exam and pap  No c/o  Off OC /not needed Regular [employer]ensea

## 2023-07-14 NOTE — Patient Instructions (Addendum)
 Make sure your exercise incorporates strength training  If not  Add some strength training to your routine, this is important for bone and brain health and can reduce your risk of falls and help your body use insulin properly and regulate weight  Light weights, exercise bands , and internet videos are a good way to start  Yoga (chair or regular), machines , floor exercises or a gym with machines are also good options    Lab today   Pap/gyn exam today   Take care of yourself

## 2023-07-14 NOTE — Assessment & Plan Note (Signed)
 Improved with job change  GAD7 score of 1

## 2023-07-15 ENCOUNTER — Ambulatory Visit: Payer: Self-pay | Admitting: Family Medicine

## 2023-07-15 LAB — CBC WITH DIFFERENTIAL/PLATELET
Basophils Absolute: 0.1 10*3/uL (ref 0.0–0.1)
Basophils Relative: 0.8 % (ref 0.0–3.0)
Eosinophils Absolute: 0.1 10*3/uL (ref 0.0–0.7)
Eosinophils Relative: 1.2 % (ref 0.0–5.0)
HCT: 42.4 % (ref 36.0–46.0)
Hemoglobin: 14 g/dL (ref 12.0–15.0)
Lymphocytes Relative: 24.9 % (ref 12.0–46.0)
Lymphs Abs: 1.8 10*3/uL (ref 0.7–4.0)
MCHC: 33 g/dL (ref 30.0–36.0)
MCV: 86.3 fl (ref 78.0–100.0)
Monocytes Absolute: 0.3 10*3/uL (ref 0.1–1.0)
Monocytes Relative: 4.7 % (ref 3.0–12.0)
Neutro Abs: 5 10*3/uL (ref 1.4–7.7)
Neutrophils Relative %: 68.4 % (ref 43.0–77.0)
Platelets: 256 10*3/uL (ref 150.0–400.0)
RBC: 4.91 Mil/uL (ref 3.87–5.11)
RDW: 14.1 % (ref 11.5–15.5)
WBC: 7.3 10*3/uL (ref 4.0–10.5)

## 2023-07-15 LAB — COMPREHENSIVE METABOLIC PANEL WITH GFR
ALT: 31 U/L (ref 0–35)
AST: 22 U/L (ref 0–37)
Albumin: 4 g/dL (ref 3.5–5.2)
Alkaline Phosphatase: 73 U/L (ref 39–117)
BUN: 11 mg/dL (ref 6–23)
CO2: 25 meq/L (ref 19–32)
Calcium: 8.9 mg/dL (ref 8.4–10.5)
Chloride: 101 meq/L (ref 96–112)
Creatinine, Ser: 0.85 mg/dL (ref 0.40–1.20)
GFR: 84.34 mL/min (ref >60.00)
Glucose, Bld: 161 mg/dL — ABNORMAL HIGH (ref 70–99)
Potassium: 3.6 meq/L (ref 3.5–5.1)
Sodium: 136 meq/L (ref 135–145)
Total Bilirubin: 0.4 mg/dL (ref 0.2–1.2)
Total Protein: 6.9 g/dL (ref 6.0–8.3)

## 2023-07-15 LAB — LIPID PANEL
Cholesterol: 195 mg/dL (ref 0–200)
HDL: 46.4 mg/dL (ref >39.00)
LDL Cholesterol: 113 mg/dL — ABNORMAL HIGH (ref 0–99)
NonHDL: 148.88
Total CHOL/HDL Ratio: 4
Triglycerides: 178 mg/dL — ABNORMAL HIGH (ref 0.0–149.0)
VLDL: 35.6 mg/dL (ref 0.0–40.0)

## 2023-07-15 LAB — HEPATITIS C ANTIBODY: Hepatitis C Ab: NONREACTIVE

## 2023-07-15 LAB — HIV ANTIBODY (ROUTINE TESTING W REFLEX): HIV 1&2 Ab, 4th Generation: NONREACTIVE

## 2023-07-15 LAB — VITAMIN D 25 HYDROXY (VIT D DEFICIENCY, FRACTURES): VITD: 10.93 ng/mL — ABNORMAL LOW (ref 30.00–100.00)

## 2023-07-15 LAB — HEMOGLOBIN A1C: Hgb A1c MFr Bld: 6 % (ref 4.6–6.5)

## 2023-07-15 LAB — TSH: TSH: 1.79 u[IU]/mL (ref 0.35–5.50)

## 2023-07-21 LAB — CYTOLOGY - PAP
Adequacy: ABSENT
Comment: NEGATIVE
Diagnosis: NEGATIVE
High risk HPV: NEGATIVE

## 2023-08-13 ENCOUNTER — Encounter

## 2023-10-20 ENCOUNTER — Encounter: Payer: Self-pay | Admitting: Family Medicine

## 2023-10-22 ENCOUNTER — Encounter: Payer: Self-pay | Admitting: Family Medicine

## 2023-10-22 ENCOUNTER — Ambulatory Visit: Admitting: Family Medicine

## 2023-10-22 VITALS — BP 126/84 | HR 99 | Temp 98.3°F | Ht 63.5 in | Wt 267.0 lb

## 2023-10-22 DIAGNOSIS — Z3A01 Less than 8 weeks gestation of pregnancy: Secondary | ICD-10-CM | POA: Diagnosis not present

## 2023-10-22 DIAGNOSIS — N926 Irregular menstruation, unspecified: Secondary | ICD-10-CM | POA: Diagnosis not present

## 2023-10-22 DIAGNOSIS — Z3490 Encounter for supervision of normal pregnancy, unspecified, unspecified trimester: Secondary | ICD-10-CM | POA: Insufficient documentation

## 2023-10-22 DIAGNOSIS — Z23 Encounter for immunization: Secondary | ICD-10-CM | POA: Diagnosis not present

## 2023-10-22 DIAGNOSIS — Z3481 Encounter for supervision of other normal pregnancy, first trimester: Secondary | ICD-10-CM | POA: Diagnosis not present

## 2023-10-22 LAB — POCT URINE PREGNANCY: Preg Test, Ur: POSITIVE — AB

## 2023-10-22 NOTE — Assessment & Plan Note (Signed)
 New pregnancy  LMP 09/14/23 reliable  No contraception  Risk- age and obesity   Taking pnv daily  Flu shot today  Some fatigue/ no other symptoms  Urine preg test positive at home and here   Encouraged to  Drink fluids/ avoid dehydration Stay on regular schedule with eating  Continue current exercise   Discussed possible early bleeding/pregnancy loss at her age Discussed resources/ book what to expect  Antic guidance  Referral to obgyn-prefers [facility]   Call back and Er precautions noted in detail today

## 2023-10-22 NOTE — Progress Notes (Signed)
 Subjective:    Patient ID: Rachael Mora, female    DOB: 03-Jul-1980, 43 y.o.   MRN: [id number]  HPI  Wt Readings from Last 3 Encounters:  10/22/23 267 lb (121.1 kg)  07/14/23 270 lb 4 oz (122.6 kg)  02/05/22 263 lb 2 oz (119.4 kg)   46.56 kg/m  Vitals:   10/22/23 0848  BP: 126/84  Pulse: 99  Temp: 98.3 F (36.8 C)  SpO2: 99%     Pt presents for follow up of positive pregnancy test  Also flu shot   LMP 09/14/23  Was a normal period for her   Doing well  Has some worries about being older   Has never been pregnant before   Was not on birth control when she conceived  FOB is involved   Taking the pnv   Needs ref to obgyn  Went to [place] in past  Would like to go to [facility]   Is slightly tired  Also more stress lately   Urination can be more frequent - but no burning    Had a little pain in right groin    Diet- does not eat breakfast   Exercise walk and light weights   Does have trip to [place] in November   Has not bought any books   Results for orders placed or performed in visit on 10/22/23  POCT urine pregnancy   Collection Time: 10/22/23  8:57 AM  Result Value Ref Range   Preg Test, Ur Positive (A) Negative      Lab Results  Component Value Date   NA 136 07/14/2023   K 3.6 07/14/2023   CO2 25 07/14/2023   GLUCOSE 161 (H) 07/14/2023   BUN 11 07/14/2023   CREATININE 0.85 07/14/2023   CALCIUM 8.9 07/14/2023   GFR 84.34 07/14/2023   GFRNONAA 127 02/15/2008   Lab Results  Component Value Date   ALT 31 07/14/2023   AST 22 07/14/2023   ALKPHOS 73 07/14/2023   BILITOT 0.4 07/14/2023   Lab Results  Component Value Date   HGBA1C 6.0 07/14/2023   HGBA1C 6.0 02/05/2022   HGBA1C 5.3 07/08/2019    Patient Active Problem List   Diagnosis Date Noted   Pregnancy 10/22/2023   Encounter for screening for HIV 07/14/2023   Encounter for hepatitis C screening test for low risk patient 07/14/2023   Diabetes mellitus  screening 07/14/2023   Anxious mood 02/05/2022   Fatigue 02/05/2022   Chronic headaches 02/05/2022   Dysuria 10/11/2021   Hot flashes 09/06/2021   Encounter for screening mammogram for breast cancer 01/24/2021   Morbid obesity (HCC) 07/06/2020   Vitamin D  deficiency 07/06/2020   Routine general medical examination at a health care facility 07/06/2020   Encounter for gynecological examination (general) (routine) without abnormal findings 07/06/2020   Allergic rhinitis 07/06/2020   Hyperlipidemia 12/14/2006   MIGRAINES, HX OF 12/14/2006   Past Medical History:  Diagnosis Date   Anal fissure    Frequent headaches    Vesicoureteral reflux    Past Surgical History:  Procedure Laterality Date   MMK / ANTERIOR VESICOURETHROPEXY / URETHROPEXY Left 1992   Left kidney @ [facility]   Social History   Tobacco Use   Smoking status: Former   Smokeless tobacco: Never  Vaping Use   Vaping status: Never Used  Substance Use Topics   Alcohol use: Yes    Comment: Social    Drug use: Never   Family History  Adopted: Yes  Problem Relation Age of Onset   Chronic bronchitis Mother    Hyperthyroidism Mother    Allergies  Allergen Reactions   Buspar  [Buspirone ]     Dizzy Irritable    Current Outpatient Medications on File Prior to Visit  Medication Sig Dispense Refill   Prenatal Vit-Fe Fumarate-FA (PRENATAL MULTIVITAMIN) TABS tablet Take 1 tablet by mouth daily at 12 noon.     No current facility-administered medications on file prior to visit.    Review of Systems  Constitutional:  Positive for fatigue. Negative for activity change, appetite change, fever and unexpected weight change.  HENT:  Negative for congestion, ear pain, rhinorrhea, sinus pressure and sore throat.   Eyes:  Negative for pain, redness and visual disturbance.  Respiratory:  Negative for cough, shortness of breath and wheezing.   Cardiovascular:  Negative for chest pain and palpitations.  Gastrointestinal:  Negative  for abdominal pain, blood in stool, constipation, diarrhea, nausea and vomiting.  Endocrine: Negative for polydipsia and polyuria.  Genitourinary:  Negative for dysuria, frequency and urgency.       Had frequent urination (was treatment for uti) Resolved now   Musculoskeletal:  Negative for arthralgias, back pain and myalgias.  Skin:  Negative for pallor and rash.  Allergic/Immunologic: Negative for environmental allergies.  Neurological:  Negative for dizziness, syncope and headaches.  Hematological:  Negative for adenopathy. Does not bruise/bleed easily.  Psychiatric/Behavioral:  Negative for decreased concentration and dysphoric mood. The patient is not nervous/anxious.        Objective:   Physical Exam Constitutional:      General: She is not in acute distress.    Appearance: Normal appearance. She is well-developed. She is obese. She is not ill-appearing or diaphoretic.  HENT:     Head: Normocephalic and atraumatic.  Eyes:     Conjunctiva/sclera: Conjunctivae normal.     Pupils: Pupils are equal, round, and reactive to light.  Neck:     Thyroid : No thyromegaly.     Vascular: No carotid bruit or JVD.  Cardiovascular:     Rate and Rhythm: Regular rhythm. Tachycardia present.     Heart sounds: Normal heart sounds.     No gallop.  Pulmonary:     Effort: Pulmonary effort is normal. No respiratory distress.     Breath sounds: Normal breath sounds. No wheezing or rales.  Abdominal:     General: There is no distension or abdominal bruit.     Palpations: Abdomen is soft.     Comments: Fundus is not palpable above pelvic bone   Musculoskeletal:     Cervical back: Normal range of motion and neck supple.     Right lower leg: No edema.     Left lower leg: No edema.  Lymphadenopathy:     Cervical: No cervical adenopathy.  Skin:    General: Skin is warm and dry.     Coloration: Skin is not pale.     Findings: No rash.  Neurological:     Mental Status: She is alert.      Coordination: Coordination normal.     Deep Tendon Reflexes: Reflexes are normal and symmetric. Reflexes normal.  Psychiatric:        Mood and Affect: Mood normal.           Assessment & Plan:   Problem List Items Addressed This Visit       Other   Pregnancy - Primary   New pregnancy  LMP 09/14/23 reliable  No contraception  Risk- age and obesity   Taking pnv daily  Flu shot today  Some fatigue/ no other symptoms  Urine preg test positive at home and here   Encouraged to  Drink fluids/ avoid dehydration Stay on regular schedule with eating  Continue current exercise   Discussed possible early bleeding/pregnancy loss at her age Discussed resources/ book what to expect  Antic guidance  Referral to obgyn-prefers [facility]   Call back and Er precautions noted in detail today          Relevant Orders   Ambulatory referral to Obstetrics / Gynecology   Other Visit Diagnoses       Missed period       Relevant Orders   POCT urine pregnancy (Completed)   Ambulatory referral to Obstetrics / Gynecology     Need for influenza vaccination       Relevant Orders   Flu vaccine trivalent PF, 6mos and older(Flulaval,Afluria,Fluarix,Fluzone) (Completed)

## 2023-10-22 NOTE — Patient Instructions (Addendum)
 Continue prenatal vitamin  Avoid over the counter medicine   Keep up fluids  Stay in usual routine with eating   Keep up some regular exercise   I put the referral in for obgyn Please let us  know if you don't hear in 1-2 weeks to set that up (mychart message or call or letter)   Get the book  What to Expect when you are Expecting   Flu shot today

## 2023-11-03 ENCOUNTER — Ambulatory Visit (HOSPITAL_BASED_OUTPATIENT_CLINIC_OR_DEPARTMENT_OTHER)

## 2023-11-04 ENCOUNTER — Other Ambulatory Visit (HOSPITAL_BASED_OUTPATIENT_CLINIC_OR_DEPARTMENT_OTHER): Payer: Self-pay | Admitting: Obstetrics & Gynecology

## 2023-11-04 DIAGNOSIS — O3680X0 Pregnancy with inconclusive fetal viability, not applicable or unspecified: Secondary | ICD-10-CM

## 2023-11-11 ENCOUNTER — Ambulatory Visit (INDEPENDENT_AMBULATORY_CARE_PROVIDER_SITE_OTHER)

## 2023-11-11 DIAGNOSIS — O3680X0 Pregnancy with inconclusive fetal viability, not applicable or unspecified: Secondary | ICD-10-CM

## 2023-11-11 DIAGNOSIS — Z3A01 Less than 8 weeks gestation of pregnancy: Secondary | ICD-10-CM | POA: Diagnosis not present

## 2023-12-03 ENCOUNTER — Encounter (HOSPITAL_BASED_OUTPATIENT_CLINIC_OR_DEPARTMENT_OTHER): Admitting: Certified Nurse Midwife

## 2023-12-09 ENCOUNTER — Encounter (HOSPITAL_BASED_OUTPATIENT_CLINIC_OR_DEPARTMENT_OTHER): Payer: Self-pay | Admitting: Certified Nurse Midwife

## 2023-12-09 ENCOUNTER — Ambulatory Visit (INDEPENDENT_AMBULATORY_CARE_PROVIDER_SITE_OTHER): Admitting: Certified Nurse Midwife

## 2023-12-09 ENCOUNTER — Other Ambulatory Visit (HOSPITAL_COMMUNITY)
Admission: RE | Admit: 2023-12-09 | Discharge: 2023-12-09 | Disposition: A | Source: Ambulatory Visit | Attending: Certified Nurse Midwife | Admitting: Certified Nurse Midwife

## 2023-12-09 VITALS — BP 139/80 | HR 105 | Wt 266.8 lb

## 2023-12-09 DIAGNOSIS — O0990 Supervision of high risk pregnancy, unspecified, unspecified trimester: Secondary | ICD-10-CM | POA: Diagnosis not present

## 2023-12-09 DIAGNOSIS — Z6741 Type O blood, Rh negative: Secondary | ICD-10-CM

## 2023-12-09 DIAGNOSIS — O09519 Supervision of elderly primigravida, unspecified trimester: Secondary | ICD-10-CM | POA: Insufficient documentation

## 2023-12-09 DIAGNOSIS — Z3A12 12 weeks gestation of pregnancy: Secondary | ICD-10-CM

## 2023-12-09 DIAGNOSIS — O09511 Supervision of elderly primigravida, first trimester: Secondary | ICD-10-CM

## 2023-12-09 DIAGNOSIS — O99210 Obesity complicating pregnancy, unspecified trimester: Secondary | ICD-10-CM | POA: Diagnosis not present

## 2023-12-09 DIAGNOSIS — Z1331 Encounter for screening for depression: Secondary | ICD-10-CM

## 2023-12-09 DIAGNOSIS — Z3481 Encounter for supervision of other normal pregnancy, first trimester: Secondary | ICD-10-CM | POA: Diagnosis not present

## 2023-12-09 DIAGNOSIS — O99211 Obesity complicating pregnancy, first trimester: Secondary | ICD-10-CM

## 2023-12-09 LAB — COMPREHENSIVE METABOLIC PANEL WITH GFR
ALT: 19 IU/L (ref 0–32)
AST: 18 IU/L (ref 0–40)
Albumin: 4 g/dL (ref 3.9–4.9)
Alkaline Phosphatase: 65 IU/L (ref 41–116)
BUN/Creatinine Ratio: 14 (ref 9–23)
BUN: 8 mg/dL (ref 6–24)
Bilirubin Total: 0.2 mg/dL (ref 0.0–1.2)
CO2: 18 mmol/L — ABNORMAL LOW (ref 20–29)
Calcium: 9.2 mg/dL (ref 8.7–10.2)
Chloride: 101 mmol/L (ref 96–106)
Creatinine, Ser: 0.57 mg/dL (ref 0.57–1.00)
Globulin, Total: 2.8 g/dL (ref 1.5–4.5)
Glucose: 69 mg/dL — ABNORMAL LOW (ref 70–99)
Potassium: 4.5 mmol/L (ref 3.5–5.2)
Sodium: 135 mmol/L (ref 134–144)
Total Protein: 6.8 g/dL (ref 6.0–8.5)
eGFR: 116 mL/min/1.73 (ref >59)

## 2023-12-09 LAB — HEMOGLOBIN A1C
Est. average glucose Bld gHb Est-mCnc: 114 mg/dL
Hgb A1c MFr Bld: 5.6 % (ref 4.8–5.6)

## 2023-12-09 NOTE — Progress Notes (Signed)
 INITIAL PRENATAL VISIT  Subjective:   Rachael Mora is being seen today for her first obstetrical visit.  This is a planned pregnancy. This is a desired pregnancy.  She is at [redacted]w[redacted]d gestation by LMP confirmed by US . Relationship with FOB: significant other, not living together. Patient does intend to breast feed. Pregnancy history fully reviewed.  Patient reports no complaints.  Indications for ASA therapy (per uptodate) One of the following: Previous pregnancy with preeclampsia, especially early onset and with an adverse outcome No Multifetal gestation No Chronic hypertension No Type 1 or 2 diabetes mellitus No Chronic kidney disease No Autoimmune disease (antiphospholipid syndrome, systemic lupus erythematosus) No  Two or more of the following: Nulliparity Yes Obesity (body mass index >30 kg/m2) Yes Family history of preeclampsia in mother or sister: Pt adopted Age >=35 years Yes Sociodemographic characteristics (African American race, low socioeconomic level) No Personal risk factors (eg, previous pregnancy with low birth weight or small for gestational age infant, previous adverse pregnancy outcome [eg, stillbirth], interval >10 years between pregnancies) No   Review of Systems:   Review of Systems  Objective:    Obstetric History OB History  Gravida Para Term Preterm AB Living  1 0 0 0 0 0  SAB IAB Ectopic Multiple Live Births  0 0 0 0 0    # Outcome Date GA Lbr Len/2nd Weight Sex Type Anes PTL Lv  1 Current             Past Medical History:  Diagnosis Date   Anal fissure    Frequent headaches    Vesicoureteral reflux     Past Surgical History:  Procedure Laterality Date   MMK / ANTERIOR VESICOURETHROPEXY / URETHROPEXY Left 1992   Left kidney @ [facility]    Current Outpatient Medications on File Prior to Visit  Medication Sig Dispense Refill   Prenatal Vit-Fe Fumarate-FA (PRENATAL MULTIVITAMIN) TABS tablet Take 1 tablet by mouth daily at 12 noon.      No current facility-administered medications on file prior to visit.    Allergies  Allergen Reactions   Buspar  [Buspirone ]     Dizzy Irritable     Social History:  reports that she has quit smoking. She has never used smokeless tobacco. She reports current alcohol use. She reports that she does not use drugs.  Family History  Adopted: Yes  Problem Relation Age of Onset   Chronic bronchitis Mother    Hyperthyroidism Mother     The following portions of the patient's history were reviewed and updated as appropriate: allergies, current medications, past family history, past medical history, past social history, past surgical history and problem list.  Review of Systems Review of Systems  Constitutional: Negative.   HENT: Negative.    Eyes: Negative.   Respiratory: Negative.    Cardiovascular: Negative.   Gastrointestinal: Negative.   Endocrine: Negative.   Genitourinary: Negative.   Musculoskeletal: Negative.   Skin: Negative.   Allergic/Immunologic: Negative.   Neurological: Negative.   Hematological: Negative.   Psychiatric/Behavioral: Negative.    All other systems reviewed and are negative.     Physical Exam:  BP 139/80   Pulse (!) 105   Wt 266 lb 12.8 oz (121 kg)   LMP 09/14/2023   BMI 46.52 kg/m  CONSTITUTIONAL: Well-developed, well-nourished female in no acute distress.  HENT:  Normocephalic, atraumatic.  Oropharynx is clear and moist EYES: Conjunctivae normal. No scleral icterus.  NECK: Normal range of motion, supple, no  masses.  Normal thyroid .  SKIN: Skin is warm and dry. No rash noted. Not diaphoretic. No erythema. No pallor. MUSCULOSKELETAL: Normal range of motion. No tenderness.  No cyanosis, clubbing, or edema.   NEUROLOGIC: Alert and oriented to person, place, and time. Normal muscle tone coordination.  PSYCHIATRIC: Normal mood and affect. Normal behavior. Normal judgment and thought content. CARDIOVASCULAR: Normal heart rate noted, regular  rhythm RESPIRATORY: Clear to auscultation bilaterally. Effort and breath sounds normal, no problems with respiration noted. ABDOMEN: Soft, normal bowel sounds, no distention noted.  No tenderness, rebound or guarding.       Movement: Absent       Assessment:    Pregnancy: G1P0000  1. Advanced maternal age, primigravida, antepartum (Primary) - Pt taking PNV. Start ASA 81mg  po once daily. - Cervicovaginal ancillary only - Culture, OB Urine - ABO/Rh; Future - Antibody screen; Future - CBC; Future - Hepatitis B surface antigen; Future - HIV Antibody (routine testing w rflx); Future - HIV (Save tube for possible reflex); Future - RPR; Future - Rubella screen; Future - Hepatitis C antibody; Future - Hemoglobin A1c - Comp Met (CMET) - US  MFM OB DETAIL +14 WK; Future  2. Supervision of high risk pregnancy, antepartum - Comp Met (CMET) - US  MFM OB DETAIL +14 WK; Future  3. [redacted] weeks gestation of pregnancy - Comp Met (CMET)  4. Maternal morbid obesity, antepartum (HCC) - Comp Met (CMET) - US  MFM OB DETAIL +14 WK; Future  5. Supervision of elderly primigravida, first trimester - PANORAMA PRENATAL TEST  6. Encounter for supervision of other normal pregnancy, first trimester - HORIZON Basic Panel    Plan:  Start ASA 81mg  po once daily  Initial labs drawn. Prenatal vitamins. Problem list reviewed and updated. Reviewed in detail the nature of the practice with collaborative care between  Genetic screening discussed: NIPS/First trimester screen/Quad/AFP requested. Role of ultrasound in pregnancy discussed; Anatomy US : requested. Amniocentesis discussed: not indicated. Follow up in 4 weeks. Weight gain recommendations per IOM guidelines reviewed: underweight/BMI 18.5 or less > 28 - 40 lbs; normal weight/BMI 18.5 - 24.9 > 25 - 35 lbs; overweight/BMI 25 - 29.9 > 15 - 25 lbs; obese/BMI  30 or more > 11 - 20 lbs.  Discussed clinic routines, schedule of care and testing, genetic  screening options, involvement of students and residents under the direct supervision of APPs and doctors and presence of female providers. Pt verbalized understanding.   Tad Arland POUR, CNM 12/09/2023 12:38 PM

## 2023-12-10 ENCOUNTER — Ambulatory Visit (HOSPITAL_BASED_OUTPATIENT_CLINIC_OR_DEPARTMENT_OTHER): Payer: Self-pay | Admitting: Certified Nurse Midwife

## 2023-12-10 DIAGNOSIS — Z2839 Other underimmunization status: Secondary | ICD-10-CM | POA: Insufficient documentation

## 2023-12-10 LAB — CERVICOVAGINAL ANCILLARY ONLY
Chlamydia: NEGATIVE
Comment: NEGATIVE
Comment: NORMAL
Neisseria Gonorrhea: NEGATIVE

## 2023-12-10 LAB — HEPATITIS C ANTIBODY: Hep C Virus Ab: NONREACTIVE

## 2023-12-10 LAB — CBC
Hematocrit: 42.5 % (ref 34.0–46.6)
Hemoglobin: 13.7 g/dL (ref 11.1–15.9)
MCH: 29.5 pg (ref 26.6–33.0)
MCHC: 32.2 g/dL (ref 31.5–35.7)
MCV: 91 fL (ref 79–97)
Platelets: 234 x10E3/uL (ref 150–450)
RBC: 4.65 x10E6/uL (ref 3.77–5.28)
RDW: 15.2 % (ref 11.7–15.4)
WBC: 8.8 x10E3/uL (ref 3.4–10.8)

## 2023-12-10 LAB — HEPATITIS B SURFACE ANTIGEN: Hepatitis B Surface Ag: NEGATIVE

## 2023-12-10 LAB — ABO/RH: Rh Factor: NEGATIVE

## 2023-12-10 LAB — ANTIBODY SCREEN: Antibody Screen: NEGATIVE

## 2023-12-10 LAB — RUBELLA SCREEN: Rubella Antibodies, IGG: <0.9 {index} — ABNORMAL LOW (ref >0.99)

## 2023-12-10 LAB — HIV ANTIBODY (ROUTINE TESTING W REFLEX): HIV Screen 4th Generation wRfx: NONREACTIVE

## 2023-12-10 LAB — RPR: RPR Ser Ql: NONREACTIVE

## 2023-12-11 LAB — CULTURE, OB URINE

## 2023-12-11 LAB — URINE CULTURE, OB REFLEX

## 2023-12-15 DIAGNOSIS — Z6741 Type O blood, Rh negative: Secondary | ICD-10-CM | POA: Insufficient documentation

## 2023-12-15 LAB — PANORAMA PRENATAL TEST FULL PANEL:PANORAMA TEST PLUS 5 ADDITIONAL MICRODELETIONS: FETAL FRACTION: 2.8

## 2024-01-06 ENCOUNTER — Ambulatory Visit (INDEPENDENT_AMBULATORY_CARE_PROVIDER_SITE_OTHER): Admitting: Certified Nurse Midwife

## 2024-01-06 VITALS — BP 135/86 | HR 96 | Wt 264.0 lb

## 2024-01-06 DIAGNOSIS — O0990 Supervision of high risk pregnancy, unspecified, unspecified trimester: Secondary | ICD-10-CM | POA: Diagnosis not present

## 2024-01-06 DIAGNOSIS — O09892 Supervision of other high risk pregnancies, second trimester: Secondary | ICD-10-CM

## 2024-01-06 DIAGNOSIS — Z3A16 16 weeks gestation of pregnancy: Secondary | ICD-10-CM

## 2024-01-06 DIAGNOSIS — O99212 Obesity complicating pregnancy, second trimester: Secondary | ICD-10-CM

## 2024-01-06 DIAGNOSIS — O09512 Supervision of elderly primigravida, second trimester: Secondary | ICD-10-CM

## 2024-01-06 DIAGNOSIS — O09519 Supervision of elderly primigravida, unspecified trimester: Secondary | ICD-10-CM

## 2024-01-06 DIAGNOSIS — Z6741 Type O blood, Rh negative: Secondary | ICD-10-CM

## 2024-01-06 NOTE — Progress Notes (Signed)
 PRENATAL VISIT NOTE  Subjective:  Rachael Mora is a 43 y.o. G1P0000 at [redacted]w[redacted]d being seen today for ongoing prenatal care.  She is currently monitored for the following issues for this  pregnancy and has Hyperlipidemia; MIGRAINES, HX OF; Morbid obesity (HCC); Vitamin D  deficiency; Allergic rhinitis; Hot flashes; Anxious mood; Chronic headaches; Supervision of high risk pregnancy, antepartum; Advanced maternal age, primigravida, antepartum; Maternal morbid obesity, antepartum (HCC); Rubella non-immune status, antepartum; and Type O blood, Rh negative on their problem list.  Patient reports no complaints.  Contractions: Not present. Vag. Bleeding: None.  Movement: Absent. Denies leaking of fluid.   The following portions of the patient's history were reviewed and updated as appropriate: allergies, current medications, past family history, past medical history, past social history, past surgical history and problem list.   Objective:   Vitals:   01/06/24 1553  BP: 135/86  Pulse: 96  Weight: 264 lb (119.7 kg)    Fetal Status:  Fetal Heart Rate (bpm): 150   Movement: Absent    General: Alert, oriented and cooperative. Patient is in no acute distress.  Skin: Skin is warm and dry. No rash noted.   Cardiovascular: Normal heart rate noted  Respiratory: Normal respiratory effort, no problems with respiration noted  Abdomen: Soft, gravid, appropriate for gestational age.  Pain/Pressure: Absent     Pelvic: Cervical exam deferred        Extremities: Normal range of motion.  Edema: None  Mental Status: Normal mood and affect. Normal behavior. Normal judgment and thought content.      12/09/2023   12:17 PM 07/14/2023    4:02 PM 02/05/2022    2:48 PM  Depression screen PHQ 2/9  Decreased Interest 0 0 1  Down, Depressed, Hopeless 0 0 1  PHQ - 2 Score 0 0 2  Altered sleeping 0 1 2  Tired, decreased energy 1 0 2  Change in appetite 0 1 0  Feeling bad or failure about yourself  0 0 1   Trouble concentrating 0 0 0  Moving slowly or fidgety/restless 0 0 0  Suicidal thoughts 0 0 0  PHQ-9 Score 1 2  7    Difficult doing work/chores Not difficult at all Not difficult at all Somewhat difficult     Data saved with a previous flowsheet row definition        12/09/2023   12:17 PM 07/14/2023    4:02 PM 02/05/2022    2:48 PM 02/05/2021    4:11 PM  GAD 7 : Generalized Anxiety Score  Nervous, Anxious, on Edge 1 1 1 2   Control/stop worrying 0 0 0 1  Worry too much - different things 0 0 1 1  Trouble relaxing 0 0 2 1  Restless 0 0 2 0  Easily annoyed or irritable 0 0 3 2  Afraid - awful might happen 0 0 1 0  Total GAD 7 Score 1 1 10 7   Anxiety Difficulty Not difficult at all Not difficult at all Somewhat difficult Somewhat difficult    Assessment and Plan:  Pregnancy: G1P0000 at [redacted]w[redacted]d  1. Advanced maternal age, primigravida, antepartum (Primary) - Pt taking PNV. Start ASA 81mg  po once daily.   2. Supervision of high risk pregnancy, antepartum - US  MFM OB DETAIL +14 WK; Future   3. [redacted] weeks gestation of pregnancy - Pt plans AFP today   4. Maternal morbid obesity, antepartum (HCC) - US  MFM OB DETAIL +14 WK; Future   5. Supervision of  elderly primigravida, first trimester - PANORAMA PRENATAL TEST Low-Risk    Preterm labor symptoms and general obstetric precautions including but not limited to vaginal bleeding, contractions, leaking of fluid and fetal movement were reviewed in detail with the patient. Please refer to After Visit Summary for other counseling recommendations.   No follow-ups on file.  Future Appointments  Date Time Provider Department Center  02/03/2024  9:00 AM [facility] PROVIDER 1 [facility]  02/03/2024  9:30 AM [facility] US4 [facility]  02/04/2024  2:15 PM Delores Nidia CROME, FNP [facility] [address]  03/01/2024  8:15 AM Delores Nidia CROME, FNP [facility] [address]  04/01/2024  8:30 AM [facility]YN LAB [facility] [address]  04/01/2024  8:55 AM Darreon Lutes,  Arland POUR, CNM [facility] [address]  04/11/2024  3:55 PM Aryn Kops, Arland POUR, CNM [facility] [address]  04/25/2024  3:55 PM Sherida Dobkins, Arland POUR, CNM [facility] [phone] [address]    Arland POUR Roller, CNM

## 2024-01-07 LAB — AFP, SERUM, OPEN SPINA BIFIDA
AFP MoM: 1.23
AFP Value: 29.6 ng/mL
Gest. Age on Collection Date: 16.2 wk
Maternal Age At EDD: 43.6 a
OSBR Risk 1 IN: 5926
Test Results:: NEGATIVE
Weight: 264 [lb_av]

## 2024-01-08 ENCOUNTER — Ambulatory Visit (HOSPITAL_BASED_OUTPATIENT_CLINIC_OR_DEPARTMENT_OTHER): Payer: Self-pay | Admitting: Certified Nurse Midwife

## 2024-01-08 DIAGNOSIS — O0990 Supervision of high risk pregnancy, unspecified, unspecified trimester: Secondary | ICD-10-CM

## 2024-02-03 ENCOUNTER — Ambulatory Visit: Payer: Self-pay | Attending: Obstetrics and Gynecology | Admitting: Maternal & Fetal Medicine

## 2024-02-03 ENCOUNTER — Ambulatory Visit (HOSPITAL_BASED_OUTPATIENT_CLINIC_OR_DEPARTMENT_OTHER)

## 2024-02-03 VITALS — BP 135/86 | HR 90

## 2024-02-03 DIAGNOSIS — O0992 Supervision of high risk pregnancy, unspecified, second trimester: Secondary | ICD-10-CM | POA: Diagnosis not present

## 2024-02-03 DIAGNOSIS — E669 Obesity, unspecified: Secondary | ICD-10-CM

## 2024-02-03 DIAGNOSIS — O360120 Maternal care for anti-D [Rh] antibodies, second trimester, not applicable or unspecified: Secondary | ICD-10-CM | POA: Diagnosis not present

## 2024-02-03 DIAGNOSIS — D259 Leiomyoma of uterus, unspecified: Secondary | ICD-10-CM

## 2024-02-03 DIAGNOSIS — Z3A19 19 weeks gestation of pregnancy: Secondary | ICD-10-CM | POA: Insufficient documentation

## 2024-02-03 DIAGNOSIS — Z363 Encounter for antenatal screening for malformations: Secondary | ICD-10-CM | POA: Diagnosis not present

## 2024-02-03 DIAGNOSIS — O09512 Supervision of elderly primigravida, second trimester: Secondary | ICD-10-CM | POA: Insufficient documentation

## 2024-02-03 DIAGNOSIS — O3412 Maternal care for benign tumor of corpus uteri, second trimester: Secondary | ICD-10-CM

## 2024-02-03 DIAGNOSIS — O09519 Supervision of elderly primigravida, unspecified trimester: Secondary | ICD-10-CM

## 2024-02-03 DIAGNOSIS — Z3A20 20 weeks gestation of pregnancy: Secondary | ICD-10-CM | POA: Diagnosis not present

## 2024-02-03 DIAGNOSIS — O99212 Obesity complicating pregnancy, second trimester: Secondary | ICD-10-CM

## 2024-02-03 DIAGNOSIS — O0990 Supervision of high risk pregnancy, unspecified, unspecified trimester: Secondary | ICD-10-CM

## 2024-02-03 NOTE — Progress Notes (Signed)
 "  Patient information  Patient Name: Rachael Mora  Patient MRN:   [id number]  Referring practice: MFM Referring Provider: [facility]  Problem List   Patient Active Problem List   Diagnosis Date Noted   Type O blood, Rh negative 12/15/2023   Rubella non-immune status, antepartum 12/10/2023   Supervision of high risk pregnancy, antepartum 12/09/2023   Advanced maternal age, primigravida, antepartum 12/09/2023   Maternal morbid obesity, antepartum (HCC) 12/09/2023   Anxious mood 02/05/2022   Chronic headaches 02/05/2022   Hot flashes 09/06/2021   Morbid obesity (HCC) 07/06/2020   Vitamin D  deficiency 07/06/2020   Allergic rhinitis 07/06/2020   Hyperlipidemia 12/14/2006   MIGRAINES, HX OF 12/14/2006   Maternal Fetal medicine Consult  Rachael Mora is a 44 y.o. G1P0000 at [redacted]w[redacted]d here for ultrasound and consultation. Rachael Mora is doing well today with no acute concerns. Today we focused on the following:   Obesity in pregnancy  The patient is here for an anatomy ultrasound. Her pregnancy is complicated by a pre-gravid BMI of 47. The patient was counseled that obesity in pregnancy is common and most patients still have healthy outcomes, but that the risk of complications is dose dependent and increases with rising BMI, particularly with BMI >=40. The most common risks include gestational diabetes, hypertensive disorders, and cesarean delivery, while more serious risks such as blood clots, stillbirth, and certain birth defects remain uncommon overall but occur more frequently with higher BMI. For patients with a pregravid BMI of at least 40, the typical prenatal course includes early diabetes screening if additional risk factors are present, routine repeat screening at 24-28 weeks, low-dose aspirin (162 mg) when indicated for preeclampsia risk reduction, third-trimester fetal growth surveillance, and consideration of antenatal testing in the late third trimester to  reduce the risk of stillbirth. I emphasized that weight loss is not recommended during pregnancy, but that avoiding excessive weight gain, following a balanced diet, and engaging in regular moderate exercise significantly reduce risk.   We discussed the presence of a 5 cm fibroid located on the right side of the uterus above the cervix. I reviewed that this finding is unlikely to cause major pregnancy complications; however, it can be associated with increased pain, interval fibroid growth, and potential fetal growth abnormalities. Ongoing monitoring during the pregnancy is recommended.  Sonographic findings Single intrauterine pregnancy at 19w 4d. Fetal cardiac activity:  Observed and appears normal. Presentation: Cephalic. The anatomic structures that were well seen appear normal. Due to poor acoustic windows some structures remain suboptimally visualized. Fetal biometry shows the estimated fetal weight of 0 lb 10 oz, 291 grams (36%). Amniotic fluid: Within normal limits.  MVP: 4.81 cm. Placenta: Anterior. Adnexa: No abnormality visualized. Cervical length: 3.4 cm.  RECOMMENDATIONS -Gestational weight-gain goal 11-20 lb -Early GDM screening only if additional risk factors present; routine screening at 24-28 weeks -Aspirin 162 mg daily between 12-28 weeks when additional preeclampsia risk factors are present -Serial growth ultrasounds starting ~28 weeks, every 4-5 weeks -Antenatal testing weekly starting at 34 weeks -Nutrition counseling with low threshold for dietitian referral -Moderate aerobic exercise ~150 min/week as tolerated -Anesthesia planning and VTE risk assessment for delivery closer to the time of birth -Standard OB precautions were given to the patient.  -F/u with OB provider  The patient had time to ask questions that were answered to her satisfaction.  She verbalized understanding and agrees to proceed with the plan above.   I spent 60 minutes reviewing  the patients  chart, including labs and images as well as counseling the patient about her medical conditions. Greater than 50% of the time was spent in direct face-to-face patient counseling.  Delora Smaller  MFM, [place]   02/03/2024  2:22 PM   Review of Systems: A review of systems was performed and was negative except per HPI   Vitals and Physical Exam    02/03/2024    8:59 AM 01/06/2024    3:53 PM 12/09/2023   12:15 PM  Vitals with BMI  Weight  264 lbs 266 lbs 13 oz  Systolic 135 135 860  Diastolic 86 86 80  Pulse 90 96 105    Sitting comfortably on the sonogram table Nonlabored breathing Normal rate and rhythm Abdomen is nontender  Past pregnancies OB History  Gravida Para Term Preterm AB Living  1 0 0 0 0 0  SAB IAB Ectopic Multiple Live Births  0 0 0 0 0    # Outcome Date GA Lbr Len/2nd Weight Sex Type Anes PTL Lv  1 Current              Future Appointments  Date Time Provider Department Center  02/04/2024  2:15 PM Delores Nidia CROME, FNP [facility] [address]  03/01/2024  8:15 AM Delores Nidia CROME, FNP [facility] [address]  04/01/2024  8:30 AM [facility] [address]  04/01/2024  9:35 AM Cleotilde Ronal RAMAN, MD [facility] [address]  04/01/2024  2:15 PM [facility] PROVIDER 1 [facility]  04/01/2024  2:30 PM [facility] US3 [facility]  04/11/2024  3:55 PM Cleotilde Ronal RAMAN, MD [facility] [address]  04/25/2024  3:55 PM Lo, Arland POUR, CNM [facility] [phone] [address]      "

## 2024-02-04 ENCOUNTER — Ambulatory Visit (INDEPENDENT_AMBULATORY_CARE_PROVIDER_SITE_OTHER): Admitting: Obstetrics and Gynecology

## 2024-02-04 VITALS — BP 132/77 | HR 90 | Wt 263.8 lb

## 2024-02-04 DIAGNOSIS — Z6841 Body Mass Index (BMI) 40.0 and over, adult: Secondary | ICD-10-CM

## 2024-02-04 DIAGNOSIS — Z6741 Type O blood, Rh negative: Secondary | ICD-10-CM

## 2024-02-04 DIAGNOSIS — Z3A20 20 weeks gestation of pregnancy: Secondary | ICD-10-CM | POA: Diagnosis not present

## 2024-02-04 DIAGNOSIS — O09512 Supervision of elderly primigravida, second trimester: Secondary | ICD-10-CM

## 2024-02-04 DIAGNOSIS — O09519 Supervision of elderly primigravida, unspecified trimester: Secondary | ICD-10-CM

## 2024-02-04 DIAGNOSIS — O0990 Supervision of high risk pregnancy, unspecified, unspecified trimester: Secondary | ICD-10-CM

## 2024-02-04 NOTE — Progress Notes (Signed)
" ° °  PRENATAL VISIT NOTE  Subjective:  Rachael Mora is a 44 y.o. G1P0000 at [redacted]w[redacted]d being seen today for ongoing prenatal care.  She is currently monitored for the following issues for this high-risk pregnancy and has Hyperlipidemia; MIGRAINES, HX OF; Morbid obesity (HCC); Vitamin D  deficiency; Allergic rhinitis; Hot flashes; Anxious mood; Chronic headaches; Supervision of high risk pregnancy, antepartum; Advanced maternal age, primigravida, antepartum; Maternal morbid obesity, antepartum (HCC); Rubella non-immune status, antepartum; and Type O blood, Rh negative on their problem list.  Patient reports no complaints.  Contractions: Not present. Vag. Bleeding: None.  Movement: Absent. Denies leaking of fluid.   The following portions of the patient's history were reviewed and updated as appropriate: allergies, current medications, past family history, past medical history, past social history, past surgical history and problem list.   Objective:   Vitals:   02/04/24 1412  BP: 132/77  Pulse: 90  Weight: 263 lb 12.8 oz (119.7 kg)    Fetal Status:  Fetal Heart Rate (bpm): 152   Movement: Absent    General: Alert, oriented and cooperative. Patient is in no acute distress.  Skin: Skin is warm and dry. No rash noted.   Cardiovascular: Normal heart rate noted  Respiratory: Normal respiratory effort, no problems with respiration noted  Abdomen: Soft, gravid, appropriate for gestational age.  Pain/Pressure: Absent     Pelvic: Cervical exam deferred        Extremities: Normal range of motion.  Edema: None  Mental Status: Normal mood and affect. Normal behavior. Normal judgment and thought content.   Assessment and Plan:  Pregnancy: G1P0000 at [redacted]w[redacted]d 1. Supervision of high risk pregnancy, antepartum (Primary) BP and FHR normal Doing well, feeling regular movement    2. [redacted] weeks gestation of pregnancy Anatomy scan incomplete, follow up 3/13  3. Advanced maternal age, primigravida,  antepartum 5. BMI 45.0-49.9, adult (HCC) Continue ASA Plan serial growth u/s  Antenatal testing 34 weeks  4. Type O blood, Rh negative Rhogam at 28 weeks  Preterm labor symptoms and general obstetric precautions including but not limited to vaginal bleeding, contractions, leaking of fluid and fetal movement were reviewed in detail with the patient. Please refer to After Visit Summary for other counseling recommendations.    Future Appointments  Date Time Provider Department Center  03/01/2024  8:15 AM Delores Nidia CROME, FNP [facility] [address]  04/01/2024  8:30 AM [facility] [address]  04/01/2024  9:35 AM Cleotilde Ronal RAMAN, MD [facility] [address]  04/01/2024  2:15 PM [facility] PROVIDER 1 [facility]  04/01/2024  2:30 PM [facility] US3 [facility]  04/11/2024  3:55 PM Cleotilde Ronal RAMAN, MD [facility] [address]  04/25/2024  3:55 PM Lo, Arland POUR, CNM [facility] [phone] [address]    [name], FNP "

## 2024-02-04 NOTE — Patient Instructions (Signed)

## 2024-02-22 ENCOUNTER — Encounter (HOSPITAL_BASED_OUTPATIENT_CLINIC_OR_DEPARTMENT_OTHER): Payer: Self-pay | Admitting: Obstetrics & Gynecology

## 2024-03-01 ENCOUNTER — Encounter (HOSPITAL_BASED_OUTPATIENT_CLINIC_OR_DEPARTMENT_OTHER): Admitting: Obstetrics and Gynecology

## 2024-04-01 ENCOUNTER — Other Ambulatory Visit

## 2024-04-01 ENCOUNTER — Other Ambulatory Visit (HOSPITAL_BASED_OUTPATIENT_CLINIC_OR_DEPARTMENT_OTHER)

## 2024-04-01 ENCOUNTER — Encounter (HOSPITAL_BASED_OUTPATIENT_CLINIC_OR_DEPARTMENT_OTHER): Admitting: Obstetrics & Gynecology

## 2024-04-01 ENCOUNTER — Ambulatory Visit

## 2024-04-11 ENCOUNTER — Encounter (HOSPITAL_BASED_OUTPATIENT_CLINIC_OR_DEPARTMENT_OTHER): Payer: Self-pay | Admitting: Obstetrics & Gynecology

## 2024-04-25 ENCOUNTER — Encounter (HOSPITAL_BASED_OUTPATIENT_CLINIC_OR_DEPARTMENT_OTHER): Payer: Self-pay | Admitting: Obstetrics & Gynecology
